# Patient Record
Sex: Male | Born: 2000 | Race: Black or African American | Hispanic: No | Marital: Single | Smoking: Current some day smoker
Health system: Southern US, Community
[De-identification: ages and names within clinical notes are randomized; demographics above are authoritative.]

## PROBLEM LIST (undated history)

## (undated) ENCOUNTER — Emergency Department (HOSPITAL_COMMUNITY): Admission: EM | Payer: Self-pay | Source: Home / Self Care

## (undated) DIAGNOSIS — J45909 Unspecified asthma, uncomplicated: Secondary | ICD-10-CM

## (undated) HISTORY — DX: Unspecified asthma, uncomplicated: J45.909

## (undated) HISTORY — PX: WISDOM TOOTH EXTRACTION: SHX21

---
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
## 2019-01-29 ENCOUNTER — Other Ambulatory Visit: Payer: Self-pay

## 2019-01-29 ENCOUNTER — Telehealth: Payer: Self-pay | Admitting: Pediatrics

## 2019-01-29 DIAGNOSIS — R112 Nausea with vomiting, unspecified: Secondary | ICD-10-CM

## 2019-01-29 NOTE — Telephone Encounter (Signed)
Sent rx to MD. Check pharmacy and it is correct.

## 2019-01-29 NOTE — Telephone Encounter (Signed)
Refill not appropriate

## 2019-01-29 NOTE — Telephone Encounter (Signed)
Error

## 2019-01-29 NOTE — Telephone Encounter (Signed)
Patient advised to contact their pharmacy to have electronic request sent over for all refills.     If request has been sent previously complete the following information:     Date request sent:advised to reach out to [place]    Name of Medication:[facility] [address]     Preferred Pharmacy:[facility]    Best contact Number: [phone]

## 2019-02-01 ENCOUNTER — Other Ambulatory Visit: Payer: Self-pay

## 2019-02-01 ENCOUNTER — Encounter: Payer: Self-pay | Admitting: Pediatrics

## 2019-02-01 ENCOUNTER — Ambulatory Visit (INDEPENDENT_AMBULATORY_CARE_PROVIDER_SITE_OTHER): Payer: Medicaid Other | Admitting: Pediatrics

## 2019-02-01 VITALS — HR 72 | Wt 173.1 lb

## 2019-02-01 DIAGNOSIS — B349 Viral infection, unspecified: Secondary | ICD-10-CM

## 2019-02-01 DIAGNOSIS — Z724 Inappropriate diet and eating habits: Secondary | ICD-10-CM

## 2019-02-01 DIAGNOSIS — J4521 Mild intermittent asthma with (acute) exacerbation: Secondary | ICD-10-CM | POA: Diagnosis not present

## 2019-02-01 DIAGNOSIS — K219 Gastro-esophageal reflux disease without esophagitis: Secondary | ICD-10-CM | POA: Diagnosis not present

## 2019-02-01 LAB — POC SOFIA SARS ANTIGEN FIA: SARS:: NEGATIVE

## 2019-02-01 MED ORDER — ALBUTEROL SULFATE HFA 108 (90 BASE) MCG/ACT IN AERS
4.0000 | INHALATION_SPRAY | Freq: Once | RESPIRATORY_TRACT | Status: AC
  Administered 2019-02-01: 17:00:00 4 via RESPIRATORY_TRACT

## 2019-02-01 MED ORDER — ALBUTEROL SULFATE HFA 108 (90 BASE) MCG/ACT IN AERS: INHALATION_SPRAY | RESPIRATORY_TRACT | 1 refills | Status: DC

## 2019-02-01 MED ORDER — MONTELUKAST SODIUM 10 MG PO TABS: 10.0000 mg | ORAL_TABLET | Freq: Every day | ORAL | 11 refills | Status: DC

## 2019-02-01 MED ORDER — LANSOPRAZOLE 30 MG PO CPDR: 30.0000 mg | DELAYED_RELEASE_CAPSULE | Freq: Two times a day (BID) | ORAL | 1 refills | Status: DC

## 2019-02-01 NOTE — Patient Instructions (Signed)
http://[email or web address].org/conditions-[email or web address]-[email or web address]">  Asthma, Adult  Asthma is a long-term (chronic) condition that causes recurrent episodes in which the airways become tight and narrow. The airways are the passages that lead from the nose and mouth down into the lungs. Asthma episodes, also called asthma attacks, can cause coughing, wheezing, shortness of breath, and chest pain. The airways can also fill with mucus. During an attack, it can be difficult to breathe. Asthma attacks can range from minor to life threatening. Asthma cannot be cured, but medicines and lifestyle changes can help control it and treat acute attacks. What are the causes? This condition is believed to be caused by inherited (genetic) and environmental factors, but its exact cause is not known. There are many things that can bring on an asthma attack or make asthma symptoms worse (triggers). Asthma triggers are different for each person. Common triggers include:  Mold.  Dust.  Cigarette smoke.  Cockroaches.  Things that can cause allergy symptoms (allergens), such as animal dander or pollen from trees or grass.  Air pollutants such as household cleaners, wood smoke, smog, or chemical odors.  Cold air, weather changes, and winds (which increase molds and pollen in the air).  Strong emotional expressions such as crying or laughing hard.  Stress.  Certain medicines (such as aspirin) or types of medicines (such as beta-blockers).  Sulfites in foods and drinks. Foods and drinks that may contain sulfites include dried fruit, potato chips, and sparkling grape juice.  Infections or inflammatory conditions such as the flu, a cold, or inflammation of the nasal membranes (rhinitis).  Gastroesophageal reflux disease (GERD).  Exercise or strenuous activity. What are the signs or symptoms? Symptoms of this condition may occur right after asthma is triggered or many hours later. Symptoms include:  Wheezing. This can  sound like whistling when you breathe.  Excessive nighttime or early morning coughing.  Frequent or severe coughing with a common cold.  Chest tightness.  Shortness of breath.  Tiredness (fatigue) with minimal activity. How is this diagnosed? This condition is diagnosed based on:  Your medical history.  A physical exam.  Tests, which may include: ? Lung function studies and pulmonary studies (spirometry). These tests can evaluate the flow of air in your lungs. ? Allergy tests. ? Imaging tests, such as X-rays. How is this treated? There is no cure for this condition, but treatment can help control your symptoms. Treatment for asthma usually involves:  Identifying and avoiding your asthma triggers.  Using medicines to control your symptoms. Generally, two types of medicines are used to treat asthma: ? Controller medicines. These help prevent asthma symptoms from occurring. They are usually taken every day. ? Fast-acting reliever or rescue medicines. These quickly relieve asthma symptoms by widening the narrow and tight airways. They are used as needed and provide short-term relief.  Using supplemental oxygen. This may be needed during a severe episode.  Using other medicines, such as: ? Allergy medicines, such as antihistamines, if your asthma attacks are triggered by allergens. ? Immune medicines (immunomodulators). These are medicines that help control the immune system.  Creating an asthma action plan. An asthma action plan is a written plan for managing and treating your asthma attacks. This plan includes: ? A list of your asthma triggers and how to avoid them. ? Information about when medicines should be taken and when their dosage should be changed. ? Instructions about using a device called a peak flow meter. A peak flow meter measures how well the lungs are working   and the severity of your asthma. It helps you monitor your condition. Follow these instructions at  home: Controlling your home environment Control your home environment in the following ways to help avoid triggers and prevent asthma attacks:  Change your heating and air conditioning filter regularly.  Limit your use of fireplaces and wood stoves.  Get rid of pests (such as roaches and mice) and their droppings.  Throw away plants if you see mold on them.  Clean floors and dust surfaces regularly. Use unscented cleaning products.  Try to have someone else vacuum for you regularly. Stay out of rooms while they are being vacuumed and for a short while afterward. If you vacuum, use a dust mask from a hardware store, a double-layered or microfilter vacuum cleaner bag, or a vacuum cleaner with a HEPA filter.  Replace carpet with wood, tile, or vinyl flooring. Carpet can trap dander and dust.  Use allergy-proof pillows, mattress covers, and box spring covers.  Keep your bedroom a trigger-free room.  Avoid pets and keep windows closed when allergens are in the air.  Wash beddings every week in hot water and dry them in a dryer.  Use blankets that are made of polyester or cotton.  Clean bathrooms and kitchens with bleach. If possible, have someone repaint the walls in these rooms with mold-resistant paint. Stay out of the rooms that are being cleaned and painted.  Wash your hands often with soap and water. If soap and water are not available, use hand sanitizer.  Do not allow anyone to smoke in your home. General instructions  Take over-the-counter and prescription medicines only as told by your health care provider. ? Speak with your health care provider if you have questions about how or when to take the medicines. ? Make note if you are requiring more frequent dosages.  Do not use any products that contain nicotine or tobacco, such as cigarettes and e-cigarettes. If you need help quitting, ask your health care provider. Also, avoid being exposed to secondhand smoke.  Use a peak  flow meter as told by your health care provider. Record and keep track of the readings.  Understand and use the asthma action plan to help minimize, or stop an asthma attack, without needing to seek medical care.  Make sure you stay up to date on your yearly vaccinations as told by your health care provider. This may include vaccines for the flu and pneumonia.  Avoid outdoor activities when allergen counts are high and when air quality is low.  Wear a ski mask that covers your nose and mouth during outdoor winter activities. Exercise indoors on cold days if you can.  Warm up before exercising, and take time for a cool-down period after exercise.  Keep all follow-up visits as told by your health care provider. This is important. Where to find more information  For information about asthma, turn to the Centers for Disease Control and Prevention at www.cdc.gov/asthma/faqs.htm  For air quality information, turn to AirNow at https://airnow.gov/ Contact a health care provider if:  You have wheezing, shortness of breath, or a cough even while you are taking medicine to prevent attacks.  The mucus you cough up (sputum) is thicker than usual.  Your sputum changes from clear or white to yellow, green, gray, or bloody.  Your medicines are causing side effects, such as a rash, itching, swelling, or trouble breathing.  You need to use a reliever medicine more than 2-3 times a week.  Your peak   flow reading is still at 50-79% of your personal best after following your action plan for 1 hour.  You have a fever. Get help right away if:  You are getting worse and do not respond to treatment during an asthma attack.  You are short of breath when at rest or when doing very little physical activity.  You have difficulty eating, drinking, or talking.  You have chest pain or tightness.  You develop a fast heartbeat or palpitations.  You have a bluish color to your lips or fingernails.  You  are light-headed or dizzy, or you faint.  Your peak flow reading is less than 50% of your personal best.  You feel too tired to breathe normally. Summary  Asthma is a long-term (chronic) condition that causes recurrent episodes in which the airways become tight and narrow. These episodes can cause coughing, wheezing, shortness of breath, and chest pain.  Asthma cannot be cured, but medicines and lifestyle changes can help control it and treat acute attacks.  Make sure you understand how to avoid triggers and how and when to use your medicines.  Asthma attacks can range from minor to life threatening. Get help right away if you have an asthma attack and do not respond to treatment with your usual rescue medicines. This information is not intended to replace advice given to you by your health care provider. Make sure you discuss any questions you have with your health care provider. Document Revised: 03/09/2018 Document Reviewed: 02/09/2016 Elsevier Patient Education  2020 [employer].    Food Choices for Gastroesophageal Reflux Disease, Adult When you have gastroesophageal reflux disease (GERD), the foods you eat and your eating habits are very important. Choosing the right foods can help ease the discomfort of GERD. Consider working with a diet and nutrition specialist (dietitian) to help you make healthy food choices. What general guidelines should I follow?  Eating plan  Choose healthy foods low in fat, such as fruits, vegetables, whole grains, low-fat dairy products, and lean meat, fish, and poultry.  Eat frequent, small meals instead of three large meals each day. Eat your meals slowly, in a relaxed setting. Avoid bending over or lying down until 2-3 hours after eating.  Limit high-fat foods such as fatty meats or fried foods.  Limit your intake of oils, butter, and shortening to less than 8 teaspoons each day.  Avoid the following: ? Foods that cause symptoms. These may be  different for different people. Keep a food diary to keep track of foods that cause symptoms. ? Alcohol. ? Drinking large amounts of liquid with meals. ? Eating meals during the 2-3 hours before bed.  Cook foods using methods other than frying. This may include baking, grilling, or broiling. Lifestyle  Maintain a healthy weight. Ask your health care provider what weight is healthy for you. If you need to lose weight, work with your health care provider to do so safely.  Exercise for at least 30 minutes on 5 or more days each week, or as told by your health care provider.  Avoid wearing clothes that fit tightly around your waist and chest.  Do not use any products that contain nicotine or tobacco, such as cigarettes and e-cigarettes. If you need help quitting, ask your health care provider.  Sleep with the head of your bed raised. Use a wedge under the mattress or blocks under the bed frame to raise the head of the bed. What foods are not recommended? The items listed  may not be a complete list. Talk with your dietitian about what dietary choices are best for you. Grains Pastries or quick breads with added fat. [place] toast. Vegetables Deep fried vegetables. [place] fries. Any vegetables prepared with added fat. Any vegetables that cause symptoms. For some people this may include tomatoes and tomato products, chili peppers, onions and garlic, and horseradish. Fruits Any fruits prepared with added fat. Any fruits that cause symptoms. For some people this may include citrus fruits, such as oranges, grapefruit, pineapple, and lemons. Meats and other protein foods High-fat meats, such as fatty beef or pork, hot dogs, ribs, ham, sausage, salami and bacon. Fried meat or protein, including fried fish and fried chicken. Nuts and nut butters. Dairy Whole milk and chocolate milk. Sour cream. Cream. Ice cream. Cream cheese. Milk shakes. Beverages Coffee and tea, with or without caffeine. Carbonated  beverages. Sodas. Energy drinks. Fruit juice made with acidic fruits (such as orange or grapefruit). Tomato juice. Alcoholic drinks. Fats and oils Butter. Margarine. Shortening. Ghee. Sweets and desserts Chocolate and cocoa. Donuts. Seasoning and other foods Pepper. Peppermint and spearmint. Any condiments, herbs, or seasonings that cause symptoms. For some people, this may include curry, hot sauce, or vinegar-based salad dressings. Summary  When you have gastroesophageal reflux disease (GERD), food and lifestyle choices are very important to help ease the discomfort of GERD.  Eat frequent, small meals instead of three large meals each day. Eat your meals slowly, in a relaxed setting. Avoid bending over or lying down until 2-3 hours after eating.  Limit high-fat foods such as fatty meat or fried foods. This information is not intended to replace advice given to you by your health care provider. Make sure you discuss any questions you have with your health care provider. Document Revised: 04/27/2018 Document Reviewed: 01/06/2016 Elsevier Patient Education  [place].

## 2019-02-01 NOTE — Progress Notes (Signed)
Subjective:   The patient is here today with his grandmother.     Chris Austin is a 19 y.o. male who presents for evaluation of a few concerns - chest tightness, reflux, and "eating disorder". Symptoms began 1 day ago for his chest tightness. He does have a history of asthma and his grandmother states that he has not used an albuterol inhaler since the chest tightness started.   Symptoms have been unchanged since that time. Past history is significant for asthma and allergies. In addition, he has been having nasal congestion and runny nose for the past few weeks. No fevers. No known exposures to COVID 19.   He also needs a refill of his reflux medication. He states that he had the wrong appt date and time for GI and needs a new referral. He states that he has had problems with reflux for years and it has been worsening over the past one year.   He states that he has an "eating disorder" as well and has had problems with his relationship with foods. He has not received any form of help or evaluation for this, despite it going on for years.   The following portions of the patient's history were reviewed and updated as appropriate: allergies, current medications, past family history, past medical history, past social history, past surgical history and problem list.  Review of Systems Constitutional: negative for fevers Eyes: negative for redness Ears, nose, mouth, throat, and face: negative except for nasal congestion Respiratory: negative except for asthma and chest tightness Gastrointestinal: negative except for reflux symptoms    Objective:    Pulse 72   Wt 173 lb 2 oz (78.5 kg)   SpO2 98%   BMI 22.23 kg/m  General appearance: alert and cooperative Head: Normocephalic, without obvious abnormality Eyes: negative findings: conjunctivae and sclerae normal Ears: normal TM's and external ear canals both ears Nose: clear discharge, moderate congestion Throat: lips, mucosa, and tongue  normal; teeth and gums normal Lungs: clear to auscultation bilaterally Heart: regular rate and rhythm, S1, S2 normal, no murmur, click, rub or gallop Abdomen: soft, non-tender; bowel sounds normal; no masses,  no organomegaly    Assessment:    Asthma exacerbation    Viral illness Problems with eating habits  Reflux    Plan:  .1. Mild intermittent asthma with exacerbation Pulse ox pre treatment - 98%  - POC SOFIA Antigen FIA negative  - albuterol (PROAIR HFA) 108 (90 Base) MCG/ACT inhaler; 2 puffs every 4 to 6 hours as needed for wheezing or coughing or shortness or breath  Dispense: 18 g; Refill: 1 - albuterol (VENTOLIN HFA) 108 (90 Base) MCG/ACT inhaler 4 puff - montelukast (SINGULAIR) 10 MG tablet; Take 1 tablet (10 mg total) by mouth at bedtime.  Dispense: 30 tablet; Refill: 11  2. Viral illness  Worsening signs and symptoms discussed. Rest, fluids, acetaminophen, and humidification. Follow up as needed for persistent, worsening cough, or appearance of new symptoms  3. Problems related to inappropriate diet and eating habits Referred to [facility] Specialist for initial evaluation   4. Gastroesophageal reflux disease, unspecified whether esophagitis present - Ambulatory referral to Gastroenterology - lansoprazole (PREVACID) 30 MG capsule; Take 1 capsule (30 mg total) by mouth 2 (two) times daily before a meal. Dispense generic for insurance.  Dispense: 60 capsule; Refill: 1     Appt scheduled to meet with out behavioral health specialist to discuss history of problems with eating .

## 2019-02-06 ENCOUNTER — Institutional Professional Consult (permissible substitution): Payer: Medicaid Other | Admitting: Licensed Clinical Social Worker

## 2019-02-06 ENCOUNTER — Encounter: Payer: Self-pay | Admitting: Gastroenterology

## 2019-02-26 ENCOUNTER — Encounter: Payer: Self-pay | Admitting: Gastroenterology

## 2019-02-26 ENCOUNTER — Telehealth: Payer: Self-pay | Admitting: Gastroenterology

## 2019-02-26 ENCOUNTER — Ambulatory Visit: Payer: Medicaid Other | Admitting: Gastroenterology

## 2019-02-26 NOTE — Telephone Encounter (Signed)
Patient was a no show 3rd time for new patient appointment

## 2019-02-26 NOTE — Telephone Encounter (Signed)
Please don't reschedule the patient and let the pcp know

## 2019-02-26 NOTE — Telephone Encounter (Signed)
Notified the pcp

## 2019-02-26 NOTE — Progress Notes (Deleted)
Primary Care Physician:  Richrd Sox, MD  Primary Gastroenterologist:    No chief complaint on file.   HPI:  Chris Austin is a 19 y.o. male here at the request of Dr. Dereck Leep for further evaluation of GERD.  He has had a couple of referrals over the past year, has not completed appointments.  Current Outpatient Medications  Medication Sig Dispense Refill  . albuterol (PROAIR HFA) 108 (90 Base) MCG/ACT inhaler 2 puffs every 4 to 6 hours as needed for wheezing or coughing or shortness or breath 18 g 1  . albuterol (PROVENTIL HFA;VENTOLIN HFA) 108 (90 Base) MCG/ACT inhaler Inhale 2 puffs into the lungs every 4 (four) hours as needed for wheezing (keep on at school). Take 4 puffs every 4 hours for the first 24 hours 2 Inhaler 1  . FLOVENT HFA 110 MCG/ACT inhaler One puff twice a day for asthma, brush teeth after using Flovent 1 Inhaler 5  . fluticasone (FLONASE) 50 MCG/ACT nasal spray PLACE 2 SPRAYS INTO BOTH NOSTRILS DAILY (Patient not taking: Reported on 03/30/2017) 16 g 2  . hydrocortisone 2.5 % cream APPLY TO ECZEMA TWICE DAILY FOR 1 WEEK AS NEEDED 120 g 0  . lansoprazole (PREVACID) 30 MG capsule Take 1 capsule (30 mg total) by mouth 2 (two) times daily before a meal. Dispense generic for insurance. 60 capsule 1  . loratadine (CLARITIN) 10 MG tablet Take 1 tablet (10 mg total) by mouth daily. 30 tablet 11  . montelukast (SINGULAIR) 10 MG tablet Take 1 tablet (10 mg total) by mouth at bedtime. 30 tablet 11  . SUMAtriptan (IMITREX) 25 MG tablet Take 1 tablet (25 mg total) by mouth 2 (two) times daily as needed for up to 9 days for migraine. May repeat in 2 hours if headache persists or recurs. 10 tablet 0   No current facility-administered medications for this visit.    Allergies as of 02/26/2019 - Review Complete 02/02/2019  Allergen Reaction Noted  . Amoxil [amoxicillin] Hives 10/05/2012    Past Medical History:  Diagnosis Date  . Unspecified asthma(493.90) 08/23/2012     No past surgical history on file.  Family History  Problem Relation Age of Onset  . Asthma Maternal Grandmother   . Healthy Mother   . Healthy Father     Social History   Socioeconomic History  . Marital status: Single    Spouse name: Not on file  . Number of children: Not on file  . Years of education: Not on file  . Highest education level: Not on file  Occupational History  . Not on file  Tobacco Use  . Smoking status: Passive Smoke Exposure - Never Smoker  . Smokeless tobacco: Never Used  . Tobacco comment: stepfather smokes outside  Substance and Sexual Activity  . Alcohol use: Not Currently  . Drug use: Never  . Sexual activity: Yes    Birth control/protection: Condom  Other Topics Concern  . Not on file  Social History Narrative   Lives with mother and stepfather      Social Determinants of Health   Financial Resource Strain: Medium Risk  . Difficulty of Paying Living Expenses: Somewhat hard  Food Insecurity: Food Insecurity Present  . Worried About Programme researcher, broadcasting/film/video in the Last Year: Never true  . Ran Out of Food in the Last Year: Sometimes true  Transportation Needs: No Transportation Needs  . Lack of Transportation (Medical): No  . Lack of Transportation (Non-Medical): No  Physical Activity: Insufficiently Active  . Days of Exercise per Week: 2 days  . Minutes of Exercise per Session: 30 min  Stress: Stress Concern Present  . Feeling of Stress : Very much  Social Connections: Somewhat Isolated  . Frequency of Communication with Friends and Family: More than three times a week  . Frequency of Social Gatherings with Friends and Family: More than three times a week  . Attends Religious Services: Never  . Active Member of Clubs or Organizations: No  . Attends Archivist Meetings: Never  . Marital Status: Living with partner  Intimate Partner Violence: Not At Risk  . Fear of Current or Ex-Partner: No  . Emotionally Abused: No  .  Physically Abused: No  . Sexually Abused: No      ROS:  General: Negative for anorexia, weight loss, fever, chills, fatigue, weakness. Eyes: Negative for vision changes.  ENT: Negative for hoarseness, difficulty swallowing , nasal congestion. CV: Negative for chest pain, angina, palpitations, dyspnea on exertion, peripheral edema.  Respiratory: Negative for dyspnea at rest, dyspnea on exertion, cough, sputum, wheezing.  GI: See history of present illness. GU:  Negative for dysuria, hematuria, urinary incontinence, urinary frequency, nocturnal urination.  MS: Negative for joint pain, low back pain.  Derm: Negative for rash or itching.  Neuro: Negative for weakness, abnormal sensation, seizure, frequent headaches, memory loss, confusion.  Psych: Negative for anxiety, depression, suicidal ideation, hallucinations.  Endo: Negative for unusual weight change.  Heme: Negative for bruising or bleeding. Allergy: Negative for rash or hives.    Physical Examination:  There were no vitals taken for this visit.   General: Well-nourished, well-developed in no acute distress.  Head: Normocephalic, atraumatic.   Eyes: Conjunctiva pink, no icterus. Mouth: Oropharyngeal mucosa moist and pink , no lesions erythema or exudate. Neck: Supple without thyromegaly, masses, or lymphadenopathy.  Lungs: Clear to auscultation bilaterally.  Heart: Regular rate and rhythm, no murmurs rubs or gallops.  Abdomen: Bowel sounds are normal, nontender, nondistended, no hepatosplenomegaly or masses, no abdominal bruits or    hernia , no rebound or guarding.   Rectal: *** Extremities: No lower extremity edema. No clubbing or deformities.  Neuro: Alert and oriented x 4 , grossly normal neurologically.  Skin: Warm and dry, no rash or jaundice.   Psych: Alert and cooperative, normal mood and affect.  Labs: Lab Results  Component Value Date   CREATININE 0.99 02/06/2018   BUN 13 02/06/2018   NA 142 02/06/2018   K  4.4 02/06/2018   CL 103 02/06/2018   CO2 24 02/06/2018   Lab Results  Component Value Date   ALT 8 02/06/2018   AST 14 02/06/2018   ALKPHOS 68 02/06/2018   BILITOT 0.8 02/06/2018   Lab Results  Component Value Date   WBC 6.3 01/17/2018   HGB 14.8 01/17/2018   HCT 41.9 01/17/2018   MCV 87 01/17/2018   PLT 246 01/17/2018     Imaging Studies: No results found.

## 2019-04-26 ENCOUNTER — Ambulatory Visit
Admission: EM | Admit: 2019-04-26 | Discharge: 2019-04-26 | Disposition: A | Discharge status: Home / Self Care | Payer: Medicaid Other | Attending: Emergency Medicine | Admitting: Emergency Medicine

## 2019-04-26 ENCOUNTER — Encounter: Payer: Self-pay | Admitting: Emergency Medicine

## 2019-04-26 ENCOUNTER — Other Ambulatory Visit: Payer: Self-pay

## 2019-04-26 DIAGNOSIS — K0889 Other specified disorders of teeth and supporting structures: Secondary | ICD-10-CM | POA: Diagnosis not present

## 2019-04-26 DIAGNOSIS — K029 Dental caries, unspecified: Secondary | ICD-10-CM

## 2019-04-26 MED ORDER — CHLORHEXIDINE GLUCONATE 0.12 % MT SOLN: 15.0000 mL | Freq: Two times a day (BID) | OROMUCOSAL | 0 refills | Status: DC

## 2019-04-26 MED ORDER — MELOXICAM 7.5 MG PO TABS: 7.5000 mg | ORAL_TABLET | Freq: Every day | ORAL | 0 refills | Status: DC

## 2019-04-26 MED ORDER — CLINDAMYCIN HCL 300 MG PO CAPS: 300.0000 mg | ORAL_CAPSULE | Freq: Three times a day (TID) | ORAL | 0 refills | Status: AC

## 2019-04-26 NOTE — ED Provider Notes (Signed)
[facility]   [id number] 04/26/19 Arrival Time: 1106  CC: DENTAL PAIN  SUBJECTIVE:  Chris Austin is a 19 y.o. male who presents with left lower dental pain x 2 weeks.  Denies a precipitating event or trauma.  Has tried OTC analgesics without relief.  Worse with chewing.  Does not see a dentist regularly.  Reports similar symptoms in the past.  Denies fever, chills, dysphagia, odynophagia, oral or neck swelling, nausea, vomiting, chest pain, SOB.    Tried to get in to see dentist but was rescheduled due to COVID  ROS: As per HPI.  All other pertinent ROS negative.     Past Medical History:  Diagnosis Date  . Unspecified asthma(493.90) 08/23/2012   Past Surgical History:  Procedure Laterality Date  . WISDOM TOOTH EXTRACTION     Allergies  Allergen Reactions  . Amoxil [Amoxicillin] Hives   No current facility-administered medications on file prior to encounter.   Current Outpatient Medications on File Prior to Encounter  Medication Sig Dispense Refill  . albuterol (PROAIR HFA) 108 (90 Base) MCG/ACT inhaler 2 puffs every 4 to 6 hours as needed for wheezing or coughing or shortness or breath 18 g 1  . FLOVENT HFA 110 MCG/ACT inhaler One puff twice a day for asthma, brush teeth after using Flovent 1 Inhaler 5  . fluticasone (FLONASE) 50 MCG/ACT nasal spray PLACE 2 SPRAYS INTO BOTH NOSTRILS DAILY 16 g 2  . hydrocortisone 2.5 % cream APPLY TO ECZEMA TWICE DAILY FOR 1 WEEK AS NEEDED 120 g 0  . lansoprazole (PREVACID) 30 MG capsule Take 1 capsule (30 mg total) by mouth 2 (two) times daily before a meal. Dispense generic for insurance. 60 capsule 1  . loratadine (CLARITIN) 10 MG tablet Take 1 tablet (10 mg total) by mouth daily. 30 tablet 11  . montelukast (SINGULAIR) 10 MG tablet Take 1 tablet (10 mg total) by mouth at bedtime. 30 tablet 11  . SUMAtriptan (IMITREX) 25 MG tablet Take 1 tablet (25 mg total) by mouth 2 (two) times daily as needed for up to 9 days for migraine.  May repeat in 2 hours if headache persists or recurs. 10 tablet 0  . albuterol (PROVENTIL HFA;VENTOLIN HFA) 108 (90 Base) MCG/ACT inhaler Inhale 2 puffs into the lungs every 4 (four) hours as needed for wheezing (keep on at school). Take 4 puffs every 4 hours for the first 24 hours 2 Inhaler 1   Social History   Socioeconomic History  . Marital status: Single    Spouse name: Not on file  . Number of children: Not on file  . Years of education: Not on file  . Highest education level: Not on file  Occupational History  . Not on file  Tobacco Use  . Smoking status: Passive Smoke Exposure - Never Smoker  . Smokeless tobacco: Never Used  . Tobacco comment: stepfather smokes outside  Substance and Sexual Activity  . Alcohol use: Not Currently  . Drug use: Never  . Sexual activity: Yes    Birth control/protection: Condom  Other Topics Concern  . Not on file  Social History Narrative   Lives with mother and stepfather      Social Determinants of Health   Financial Resource Strain: Medium Risk  . Difficulty of Paying Living Expenses: Somewhat hard  Food Insecurity: Food Insecurity Present  . Worried About Programme researcher, broadcasting/film/video in the Last Year: Never true  . Ran Out of Food in the Last Year:  Sometimes true  Transportation Needs: No Transportation Needs  . Lack of Transportation (Medical): No  . Lack of Transportation (Non-Medical): No  Physical Activity: Insufficiently Active  . Days of Exercise per Week: 2 days  . Minutes of Exercise per Session: 30 min  Stress: Stress Concern Present  . Feeling of Stress : Very much  Social Connections: Somewhat Isolated  . Frequency of Communication with Friends and Family: More than three times a week  . Frequency of Social Gatherings with Friends and Family: More than three times a week  . Attends Religious Services: Never  . Active Member of Clubs or Organizations: No  . Attends Archivist Meetings: Never  . Marital Status:  Living with partner  Intimate Partner Violence: Not At Risk  . Fear of Current or Ex-Partner: No  . Emotionally Abused: No  . Physically Abused: No  . Sexually Abused: No   Family History  Problem Relation Age of Onset  . Asthma Maternal Grandmother   . Healthy Mother   . Healthy Father     OBJECTIVE:  Vitals:   04/26/19 1112  BP: 131/79  Pulse: 84  Resp: 17  Temp: 98.4 F (36.9 C)  TempSrc: Oral  SpO2: 98%    General appearance: alert; no distress HENT: normocephalic; atraumatic; dentition: fair; dental caries over left lower gums without areas of fluctuance; oropharynx clear, tolerating own secretions Neck: supple without LAD Lungs: normal respirations Skin: warm and dry Psychological: alert and cooperative; normal mood and affect  ASSESSMENT & PLAN:  1. Pain, dental   2. Dental caries     Meds ordered this encounter  Medications  . clindamycin (CLEOCIN) 300 MG capsule    Sig: Take 1 capsule (300 mg total) by mouth 3 (three) times daily for 10 days.    Dispense:  30 capsule    Refill:  0    Order Specific Question:   Supervising Provider    Answer:   Raylene Everts [[id number]]  . meloxicam (MOBIC) 7.5 MG tablet    Sig: Take 1 tablet (7.5 mg total) by mouth daily.    Dispense:  30 tablet    Refill:  0    Order Specific Question:   Supervising Provider    Answer:   Raylene Everts [[id number]]  . chlorhexidine (PERIDEX) 0.12 % solution    Sig: Use as directed 15 mLs in the mouth or throat 2 (two) times daily.    Dispense:  473 mL    Refill:  0    Order Specific Question:   Supervising Provider    Answer:   Raylene Everts [[id number]]   Clindamycin prescribed.  Take as directed and to completion Mobic and peridex prescribed.  Use as directed for pain Rest and push fluids maintain oral hygiene Avoid chewing on painful side of mouth Follow up with dentist as soon as possible for further evaluation and treatment  Return or go to the ED if you have any  new or worsening symptoms such as fever, chills, difficulty swallowing, painful swallowing, oral or neck swelling, nausea, vomiting, chest pain, SOB, etc...  Reviewed expectations re: course of current medical issues. Questions answered. Outlined signs and symptoms indicating need for more acute intervention. Patient verbalized understanding. After Visit Summary given.   Lestine Box, PA-C 04/26/19 1123

## 2019-04-26 NOTE — ED Triage Notes (Signed)
Pt presents with left lower dental pain that started 2 weeks ago. Denies fevers at home. Denies injury.

## 2019-04-26 NOTE — Discharge Instructions (Addendum)
Clindamycin prescribed.  Take as directed and to completion Mobic and peridex prescribed.  Use as directed for pain Rest and push fluids maintain oral hygiene Avoid chewing on painful side of mouth Follow up with dentist as soon as possible for further evaluation and treatment  Return or go to the ED if you have any new or worsening symptoms such as fever, chills, difficulty swallowing, painful swallowing, oral or neck swelling, nausea, vomiting, chest pain, SOB, etc..Marland Kitchen

## 2019-09-06 ENCOUNTER — Ambulatory Visit (INDEPENDENT_AMBULATORY_CARE_PROVIDER_SITE_OTHER): Payer: Medicaid Other | Admitting: Pediatrics

## 2019-09-06 ENCOUNTER — Other Ambulatory Visit: Payer: Self-pay

## 2019-09-06 DIAGNOSIS — Z711 Person with feared health complaint in whom no diagnosis is made: Secondary | ICD-10-CM | POA: Diagnosis not present

## 2019-09-13 ENCOUNTER — Encounter: Payer: Self-pay | Admitting: Pediatrics

## 2019-09-13 NOTE — Progress Notes (Signed)
Chelsea is here because his mom called and made the appointment with a complaint of a knot in the back of his head that she noticed while he was getting his hair braided. He does not know how long it's been present. She also mentioned a knot in his private area which he states has been gone for a week. There has been no fever, no tenderness in either area, no drainage from the lesions in either area. No warmth.    No distress White sclera, no conjunctival injection No cervical lymphadenopathy  Bony 3 cm in mid occipital region. Non tender, non drainage, no alopecia  Normal circumcised penis, no lesions, no swelling, and no tenderness of scrotum.  No focal deficits   19 yo male with bony skull lesion benign and resolved scrotal lesion  Reassurance  Questions and concerns were addressed  Follow up

## 2019-10-03 DIAGNOSIS — F3112 Bipolar disorder, current episode manic without psychotic features, moderate: Secondary | ICD-10-CM | POA: Diagnosis not present

## 2019-10-23 ENCOUNTER — Encounter: Payer: Self-pay | Admitting: Emergency Medicine

## 2019-10-23 ENCOUNTER — Ambulatory Visit
Admission: EM | Admit: 2019-10-23 | Discharge: 2019-10-23 | Disposition: A | Discharge status: Home / Self Care | Payer: Medicaid Other | Attending: Emergency Medicine | Admitting: Emergency Medicine

## 2019-10-23 ENCOUNTER — Other Ambulatory Visit: Payer: Self-pay

## 2019-10-23 DIAGNOSIS — K0889 Other specified disorders of teeth and supporting structures: Secondary | ICD-10-CM

## 2019-10-23 DIAGNOSIS — Z1152 Encounter for screening for COVID-19: Secondary | ICD-10-CM

## 2019-10-23 DIAGNOSIS — K029 Dental caries, unspecified: Secondary | ICD-10-CM | POA: Diagnosis not present

## 2019-10-23 MED ORDER — CLINDAMYCIN HCL 300 MG PO CAPS: 300.0000 mg | ORAL_CAPSULE | Freq: Three times a day (TID) | ORAL | 0 refills | Status: AC

## 2019-10-23 MED ORDER — CHLORHEXIDINE GLUCONATE 0.12 % MT SOLN: 15.0000 mL | Freq: Three times a day (TID) | OROMUCOSAL | 0 refills | Status: DC

## 2019-10-23 NOTE — ED Provider Notes (Signed)
[facility]   [id number] 10/23/19 Arrival Time: 1707  CC: DENTAL PAIN  SUBJECTIVE:  Chris Austin is a 19 y.o. male who presents with dental pain for the past 1 month.  Denies a precipitating event or trauma.  Localizes pain to left lower gum.  Has tried OTC analgesics without relief.  Worse with chewing.  Does not see a dentist regularly.  Denies similar symptoms in the past.  Denies fever, chills, dysphagia, odynophagia, oral or neck swelling, nausea, vomiting, chest pain, SOB.    He would like to have COVID-19 test completed.  ROS: As per HPI.  All other pertinent ROS negative.     Past Medical History:  Diagnosis Date  . Unspecified asthma(493.90) 08/23/2012   Past Surgical History:  Procedure Laterality Date  . WISDOM TOOTH EXTRACTION     Allergies  Allergen Reactions  . Amoxil [Amoxicillin] Hives   No current facility-administered medications on file prior to encounter.   Current Outpatient Medications on File Prior to Encounter  Medication Sig Dispense Refill  . albuterol (PROAIR HFA) 108 (90 Base) MCG/ACT inhaler 2 puffs every 4 to 6 hours as needed for wheezing or coughing or shortness or breath 18 g 1  . albuterol (PROVENTIL HFA;VENTOLIN HFA) 108 (90 Base) MCG/ACT inhaler Inhale 2 puffs into the lungs every 4 (four) hours as needed for wheezing (keep on at school). Take 4 puffs every 4 hours for the first 24 hours 2 Inhaler 1  . FLOVENT HFA 110 MCG/ACT inhaler One puff twice a day for asthma, brush teeth after using Flovent 1 Inhaler 5  . fluticasone (FLONASE) 50 MCG/ACT nasal spray PLACE 2 SPRAYS INTO BOTH NOSTRILS DAILY 16 g 2  . hydrocortisone 2.5 % cream APPLY TO ECZEMA TWICE DAILY FOR 1 WEEK AS NEEDED 120 g 0  . lansoprazole (PREVACID) 30 MG capsule Take 1 capsule (30 mg total) by mouth 2 (two) times daily before a meal. Dispense generic for insurance. 60 capsule 1  . loratadine (CLARITIN) 10 MG tablet Take 1 tablet (10 mg total) by mouth daily. 30  tablet 11  . meloxicam (MOBIC) 7.5 MG tablet Take 1 tablet (7.5 mg total) by mouth daily. 30 tablet 0  . montelukast (SINGULAIR) 10 MG tablet Take 1 tablet (10 mg total) by mouth at bedtime. 30 tablet 11  . SUMAtriptan (IMITREX) 25 MG tablet Take 1 tablet (25 mg total) by mouth 2 (two) times daily as needed for up to 9 days for migraine. May repeat in 2 hours if headache persists or recurs. 10 tablet 0   Social History   Socioeconomic History  . Marital status: Single    Spouse name: Not on file  . Number of children: Not on file  . Years of education: Not on file  . Highest education level: Not on file  Occupational History  . Not on file  Tobacco Use  . Smoking status: Passive Smoke Exposure - Never Smoker  . Smokeless tobacco: Never Used  . Tobacco comment: stepfather smokes outside  Vaping Use  . Vaping Use: Never used  Substance and Sexual Activity  . Alcohol use: Not Currently  . Drug use: Never  . Sexual activity: Yes    Birth control/protection: Condom  Other Topics Concern  . Not on file  Social History Narrative   Lives with mother and stepfather      Social Determinants of Health   Financial Resource Strain:   . Difficulty of Paying Living Expenses: Not on file  Food Insecurity:   . Worried About Programme researcher, broadcasting/film/video in the Last Year: Not on file  . Ran Out of Food in the Last Year: Not on file  Transportation Needs:   . Lack of Transportation (Medical): Not on file  . Lack of Transportation (Non-Medical): Not on file  Physical Activity:   . Days of Exercise per Week: Not on file  . Minutes of Exercise per Session: Not on file  Stress:   . Feeling of Stress : Not on file  Social Connections:   . Frequency of Communication with Friends and Family: Not on file  . Frequency of Social Gatherings with Friends and Family: Not on file  . Attends Religious Services: Not on file  . Active Member of Clubs or Organizations: Not on file  . Attends Tax inspector Meetings: Not on file  . Marital Status: Not on file  Intimate Partner Violence:   . Fear of Current or Ex-Partner: Not on file  . Emotionally Abused: Not on file  . Physically Abused: Not on file  . Sexually Abused: Not on file   Family History  Problem Relation Age of Onset  . Asthma Maternal Grandmother   . Healthy Mother   . Healthy Father     OBJECTIVE:  Vitals:   10/23/19 1742 10/23/19 1743  BP: 124/70   Pulse: 83   Resp: 17   Temp: 97.9 F (36.6 C)   TempSrc: Oral   SpO2: 99%   Weight:  160 lb (72.6 kg)  Height:  6\' 4"  (1.93 m)    Physical Exam Vitals and nursing note reviewed.  Constitutional:      General: He is not in acute distress.    Appearance: Normal appearance. He is normal weight. He is not ill-appearing, toxic-appearing or diaphoretic.  HENT:     Right Ear: Tympanic membrane, ear canal and external ear normal. There is no impacted cerumen.     Left Ear: Tympanic membrane, ear canal and external ear normal. There is no impacted cerumen.     Mouth/Throat:     Lips: Pink.     Mouth: Mucous membranes are moist.     Dentition: Abnormal dentition. Dental caries present. No dental abscesses.     Tonsils: No tonsillar exudate or tonsillar abscesses.      Comments: Dental caries present Cardiovascular:     Rate and Rhythm: Normal rate and regular rhythm.     Pulses: Normal pulses.     Heart sounds: Normal heart sounds. No murmur heard.  No friction rub. No gallop.   Pulmonary:     Effort: Pulmonary effort is normal. No respiratory distress.     Breath sounds: Normal breath sounds. No stridor. No wheezing, rhonchi or rales.  Chest:     Chest wall: No tenderness.  Neurological:     Mental Status: He is alert and oriented to person, place, and time.      ASSESSMENT & PLAN:  1. Encounter for screening for COVID-19   2. Pain, dental   3. Dental caries     Meds ordered this encounter  Medications  . clindamycin (CLEOCIN) 300 MG  capsule    Sig: Take 1 capsule (300 mg total) by mouth 3 (three) times daily for 7 days.    Dispense:  21 capsule    Refill:  0  . chlorhexidine (PERIDEX) 0.12 % solution    Sig: Use as directed 15 mLs in the mouth or throat 3 (three) times  daily after meals.    Dispense:  120 mL    Refill:  0    Discharge instructions  Chlorhexidine mouthwash was prescribed/take as directed Clindamycin was prescribed Continue to take OTC Tylenol/ibuprofen as needed for pain Recommend soft diet until evaluated by dentist Maintain oral hygiene care Follow up with dentist as soon as possible for further evaluation and treatment  Return or go to the ED if you have any new or worsening symptoms such as fever, chills, difficulty swallowing, painful swallowing, oral or neck swelling, nausea, vomiting, chest pain, SOB, etc...  Reviewed expectations re: course of current medical issues. Questions answered. Outlined signs and symptoms indicating need for more acute intervention. Patient verbalized understanding. After Visit Summary given.   Durward Parcel, FNP 10/23/19 1757

## 2019-10-23 NOTE — Discharge Instructions (Signed)
Chlorhexidine mouthwash was prescribed/take as directed Clindamycin was prescribed Continue to take OTC Tylenol/ibuprofen as needed for pain Recommend soft diet until evaluated by dentist Maintain oral hygiene care Follow up with dentist as soon as possible for further evaluation and treatment  Return or go to the ED if you have any new or worsening symptoms such as fever, chills, difficulty swallowing, painful swallowing, oral or neck swelling, nausea, vomiting, chest pain, SOB, etc..Marland Kitchen

## 2019-10-23 NOTE — ED Triage Notes (Signed)
Dental pain to lower LT side x 1 month. Wants covid test, no s/s

## 2019-10-24 DIAGNOSIS — K047 Periapical abscess without sinus: Secondary | ICD-10-CM | POA: Diagnosis not present

## 2019-10-24 LAB — SARS-COV-2, NAA 2 DAY TAT

## 2019-10-24 LAB — NOVEL CORONAVIRUS, NAA: SARS-CoV-2, NAA: NOT DETECTED

## 2019-12-11 ENCOUNTER — Ambulatory Visit: Payer: Medicaid Other

## 2019-12-30 ENCOUNTER — Emergency Department (HOSPITAL_COMMUNITY): Payer: Medicaid Other

## 2019-12-30 ENCOUNTER — Other Ambulatory Visit: Payer: Self-pay

## 2019-12-30 ENCOUNTER — Emergency Department (HOSPITAL_COMMUNITY)
Admission: EM | Admit: 2019-12-30 | Discharge: 2019-12-30 | Disposition: A | Discharge status: Home / Self Care | Payer: Medicaid Other | Attending: Emergency Medicine | Admitting: Emergency Medicine

## 2019-12-30 DIAGNOSIS — Y92410 Unspecified street and highway as the place of occurrence of the external cause: Secondary | ICD-10-CM | POA: Diagnosis not present

## 2019-12-30 DIAGNOSIS — T1490XA Injury, unspecified, initial encounter: Secondary | ICD-10-CM

## 2019-12-30 DIAGNOSIS — S0181XA Laceration without foreign body of other part of head, initial encounter: Secondary | ICD-10-CM | POA: Insufficient documentation

## 2019-12-30 DIAGNOSIS — S62171A Displaced fracture of trapezium [larger multangular], right wrist, initial encounter for closed fracture: Secondary | ICD-10-CM | POA: Diagnosis not present

## 2019-12-30 DIAGNOSIS — S0990XA Unspecified injury of head, initial encounter: Secondary | ICD-10-CM | POA: Diagnosis not present

## 2019-12-30 DIAGNOSIS — S0003XA Contusion of scalp, initial encounter: Secondary | ICD-10-CM | POA: Diagnosis not present

## 2019-12-30 DIAGNOSIS — M25531 Pain in right wrist: Secondary | ICD-10-CM | POA: Diagnosis not present

## 2019-12-30 DIAGNOSIS — R9431 Abnormal electrocardiogram [ECG] [EKG]: Secondary | ICD-10-CM | POA: Diagnosis not present

## 2019-12-30 DIAGNOSIS — Z23 Encounter for immunization: Secondary | ICD-10-CM | POA: Insufficient documentation

## 2019-12-30 DIAGNOSIS — R0789 Other chest pain: Secondary | ICD-10-CM | POA: Insufficient documentation

## 2019-12-30 DIAGNOSIS — S3993XA Unspecified injury of pelvis, initial encounter: Secondary | ICD-10-CM | POA: Diagnosis not present

## 2019-12-30 DIAGNOSIS — Z041 Encounter for examination and observation following transport accident: Secondary | ICD-10-CM | POA: Diagnosis not present

## 2019-12-30 DIAGNOSIS — S6991XA Unspecified injury of right wrist, hand and finger(s), initial encounter: Secondary | ICD-10-CM | POA: Diagnosis present

## 2019-12-30 DIAGNOSIS — S199XXA Unspecified injury of neck, initial encounter: Secondary | ICD-10-CM | POA: Diagnosis not present

## 2019-12-30 LAB — SAMPLE TO BLOOD BANK

## 2019-12-30 LAB — COMPREHENSIVE METABOLIC PANEL
ALT: 18 U/L (ref 0–44)
AST: 22 U/L (ref 15–41)
Albumin: 4.3 g/dL (ref 3.5–5.0)
Alkaline Phosphatase: 59 U/L (ref 38–126)
Anion gap: 11 (ref 5–15)
BUN: 11 mg/dL (ref 6–20)
CO2: 22 mmol/L (ref 22–32)
Calcium: 9 mg/dL (ref 8.9–10.3)
Chloride: 106 mmol/L (ref 98–111)
Creatinine, Ser: 0.85 mg/dL (ref 0.61–1.24)
GFR, Estimated: >60 mL/min (ref >60)
Glucose, Bld: 114 mg/dL — ABNORMAL HIGH (ref 70–99)
Potassium: 3.7 mmol/L (ref 3.5–5.1)
Sodium: 139 mmol/L (ref 135–145)
Total Bilirubin: 0.9 mg/dL (ref 0.3–1.2)
Total Protein: 7 g/dL (ref 6.5–8.1)

## 2019-12-30 LAB — I-STAT CHEM 8, ED
BUN: 14 mg/dL (ref 6–20)
Calcium, Ion: 1.15 mmol/L (ref 1.15–1.40)
Chloride: 105 mmol/L (ref 98–111)
Creatinine, Ser: 0.8 mg/dL (ref 0.61–1.24)
Glucose, Bld: 111 mg/dL — ABNORMAL HIGH (ref 70–99)
HCT: 41 % (ref 39.0–52.0)
Hemoglobin: 13.9 g/dL (ref 13.0–17.0)
Potassium: 3.9 mmol/L (ref 3.5–5.1)
Sodium: 142 mmol/L (ref 135–145)
TCO2: 27 mmol/L (ref 22–32)

## 2019-12-30 LAB — ETHANOL: Alcohol, Ethyl (B): <10 mg/dL (ref <10)

## 2019-12-30 LAB — CBC
HCT: 39.8 % (ref 39.0–52.0)
Hemoglobin: 13.1 g/dL (ref 13.0–17.0)
MCH: 29.6 pg (ref 26.0–34.0)
MCHC: 32.9 g/dL (ref 30.0–36.0)
MCV: 90 fL (ref 80.0–100.0)
Platelets: 235 10*3/uL (ref 150–400)
RBC: 4.42 MIL/uL (ref 4.22–5.81)
RDW: 13 % (ref 11.5–15.5)
WBC: 7.4 10*3/uL (ref 4.0–10.5)
nRBC: 0 % (ref 0.0–0.2)

## 2019-12-30 LAB — LACTIC ACID, PLASMA: Lactic Acid, Venous: 1.9 mmol/L (ref 0.5–1.9)

## 2019-12-30 MED ORDER — IOHEXOL 300 MG/ML  SOLN
75.0000 mL | Freq: Once | INTRAMUSCULAR | Status: AC | PRN
  Administered 2019-12-30: 75 mL via INTRAVENOUS

## 2019-12-30 MED ORDER — METOCLOPRAMIDE HCL 5 MG/ML IJ SOLN
10.0000 mg | INTRAMUSCULAR | Status: AC
  Administered 2019-12-30: 06:00:00 10 mg via INTRAVENOUS
  Filled 2019-12-30: qty 2

## 2019-12-30 MED ORDER — LIDOCAINE-EPINEPHRINE 1 %-1:100000 IJ SOLN
10.0000 mL | Freq: Once | INTRAMUSCULAR | Status: AC
  Administered 2019-12-30: 06:00:00 10 mL
  Filled 2019-12-30: qty 1

## 2019-12-30 MED ORDER — SODIUM CHLORIDE 0.9 % IV BOLUS
500.0000 mL | Freq: Once | INTRAVENOUS | Status: AC
Start: 2019-12-30 — End: 2019-12-30
  Administered 2019-12-30: 04:00:00 500 mL via INTRAVENOUS

## 2019-12-30 MED ORDER — KETOROLAC TROMETHAMINE 15 MG/ML IJ SOLN
15.0000 mg | Freq: Once | INTRAMUSCULAR | Status: AC
  Administered 2019-12-30: 07:00:00 15 mg via INTRAVENOUS
  Filled 2019-12-30: qty 1

## 2019-12-30 MED ORDER — TETANUS-DIPHTH-ACELL PERTUSSIS 5-2.5-18.5 LF-MCG/0.5 IM SUSY
0.5000 mL | PREFILLED_SYRINGE | Freq: Once | INTRAMUSCULAR | Status: AC
  Administered 2019-12-30: 04:00:00 0.5 mL via INTRAMUSCULAR
  Filled 2019-12-30: qty 0.5

## 2019-12-30 MED ORDER — FENTANYL CITRATE (PF) 100 MCG/2ML IJ SOLN
50.0000 ug | Freq: Once | INTRAMUSCULAR | Status: AC
Start: 2019-12-30 — End: 2019-12-30
  Administered 2019-12-30: 06:00:00 50 ug via INTRAVENOUS
  Filled 2019-12-30: qty 2

## 2019-12-30 MED ORDER — NAPROXEN 500 MG PO TABS: 500.0000 mg | ORAL_TABLET | Freq: Two times a day (BID) | ORAL | 0 refills | Status: DC

## 2019-12-30 MED ORDER — METHOCARBAMOL 500 MG PO TABS: 500.0000 mg | ORAL_TABLET | Freq: Two times a day (BID) | ORAL | 0 refills | Status: DC | PRN
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
## 2019-12-30 NOTE — Discharge Instructions (Addendum)
Your found to have a fracture of your trapezium which is a bone in your wrist.  Wear the splint at all times and do not remove it.  Call to schedule a follow-up with hand specialist, Dr. Eulah Pont, to ensure proper healing.  You have been prescribed naproxen and Robaxin for management of your pain and muscle spasms.  Alternate ice and heat to areas of injury to help improve pain, as well.  With regard to your laceration, avoid soaking your wound in stagnant or dirty water such as while taking a bath. You can shower normally. Keep the area clean with mild soap and warm water. Do not apply peroxide or alcohol to your wound as this can break down newly forming skin and prolong wound healing. If you keep the area bandaged, change the dressing/bandage at least once per day. Have your sutures removed in 7-10 days.

## 2019-12-30 NOTE — Progress Notes (Signed)
Orthopedic Tech Progress Note Patient Details:  Chris Austin Jan 31, 2000 [id number]  Ortho Devices Type of Ortho Device: Arm sling,Thumb spica splint Splint Material: Fiberglass Ortho Device/Splint Location: rue Ortho Device/Splint Interventions: Ordered,Application,Adjustment   Post Interventions Patient Tolerated: Well Instructions Provided: Care of device,Adjustment of device   Trinna Post 12/30/2019, 6:52 AM

## 2019-12-30 NOTE — ED Notes (Signed)
Pt ambulatory to the restroom.  
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
## 2019-12-30 NOTE — ED Notes (Signed)
6 stiches placed on head lac.

## 2019-12-31 ENCOUNTER — Telehealth: Payer: Self-pay | Admitting: Licensed Clinical Social Worker

## 2019-12-31 ENCOUNTER — Telehealth: Payer: Self-pay

## 2019-12-31 ENCOUNTER — Encounter: Payer: Self-pay | Admitting: Emergency Medicine

## 2019-12-31 NOTE — Telephone Encounter (Signed)
Pediatric Transition Care Management Follow-up Telephone Call  Medicaid Managed Care Transition Call Status:  MM TOC Call Made  Symptoms: Has Coley A Valera developed any new symptoms since being discharged from the hospital? no  Diet/Feeding: Was your child's diet modified? no  If no- Is Jahmari A Lemler eating their normal diet?  (over 1 year) pt was supposed to follow up with stomach specialist per Mom but was not able to.  Mom would like to follow up with Dr. Laural Benes on this as well.   Home Care and Equipment/Supplies: Were home health services ordered? no Were any new equipment or medical supplies ordered?  no   Follow Up: Was there a hospital follow up appointment recommended for your child with their PCP? yes [facility]  Date/Time 01/07/20 @ 1:30pm (not all patients peds need a PCP follow up/depends on the diagnosis)   Do you have the contact number to reach the patient's PCP? yes  Was the patient referred to a specialist? no  Are transportation arrangements needed? no  If you notice any changes in Rimas A Mullane condition, call their primary care doctor or go to the Emergency Dept.  Do you have any other questions or concerns? Yes, Mom would like to follow up with Dr. Laural Benes about stomach concerns as well as injuries to head, arm, leg and concussion follow up.   SIGNATURE

## 2019-12-31 NOTE — Telephone Encounter (Signed)
Transition Care Management Unsuccessful Follow-up Telephone Call ° °Date of discharge and from where:  12/30/2019 [facility] ° °Attempts:  1st Attempt ° °Reason for unsuccessful TCM follow-up call:  Left voice message ° ° ° °

## 2020-01-01 NOTE — Telephone Encounter (Signed)
Patient has an appointment with [facility] on 01/07/2020 at 1:30pm. Closing encounter.

## 2020-01-02 DIAGNOSIS — M25572 Pain in left ankle and joints of left foot: Secondary | ICD-10-CM | POA: Diagnosis not present

## 2020-01-02 DIAGNOSIS — S63601A Unspecified sprain of right thumb, initial encounter: Secondary | ICD-10-CM | POA: Diagnosis not present

## 2020-01-02 DIAGNOSIS — M25562 Pain in left knee: Secondary | ICD-10-CM | POA: Diagnosis not present

## 2020-01-02 DIAGNOSIS — S93402A Sprain of unspecified ligament of left ankle, initial encounter: Secondary | ICD-10-CM | POA: Diagnosis not present

## 2020-01-07 ENCOUNTER — Other Ambulatory Visit: Payer: Self-pay

## 2020-01-07 ENCOUNTER — Ambulatory Visit (INDEPENDENT_AMBULATORY_CARE_PROVIDER_SITE_OTHER): Payer: Medicaid Other | Admitting: Pediatrics

## 2020-01-07 ENCOUNTER — Encounter: Payer: Self-pay | Admitting: Pediatrics

## 2020-01-07 VITALS — Wt 170.0 lb

## 2020-01-07 DIAGNOSIS — S060X0D Concussion without loss of consciousness, subsequent encounter: Secondary | ICD-10-CM

## 2020-01-08 ENCOUNTER — Other Ambulatory Visit: Payer: Self-pay | Admitting: Pediatrics

## 2020-01-08 ENCOUNTER — Telehealth: Payer: Self-pay | Admitting: Pediatrics

## 2020-01-08 ENCOUNTER — Encounter: Payer: Self-pay | Admitting: Pediatrics

## 2020-01-08 DIAGNOSIS — K219 Gastro-esophageal reflux disease without esophagitis: Secondary | ICD-10-CM

## 2020-01-08 MED ORDER — HYDROCODONE-ACETAMINOPHEN 5-325 MG PO TABS: 1.0000 | ORAL_TABLET | Freq: Four times a day (QID) | ORAL | 0 refills | Status: AC | PRN

## 2020-01-08 MED ORDER — LANSOPRAZOLE 30 MG PO CPDR: 30.0000 mg | DELAYED_RELEASE_CAPSULE | Freq: Two times a day (BID) | ORAL | 3 refills | Status: DC

## 2020-01-08 NOTE — Telephone Encounter (Signed)
Mom says you were supposed to call in some meds for pt yesterday but they are not there. Pharmacy is [facility] on [address]
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
## 2020-01-08 NOTE — Telephone Encounter (Signed)
Thanks

## 2020-01-08 NOTE — Telephone Encounter (Signed)
Thanks yes. They are there now.

## 2020-01-14 ENCOUNTER — Telehealth: Payer: Self-pay

## 2020-01-14 NOTE — Telephone Encounter (Signed)
Tc patient needs stitches remove from head, before the week is out, can that be done here? Will get patient scheduled accordingly if so.

## 2020-01-17 ENCOUNTER — Other Ambulatory Visit: Payer: Self-pay

## 2020-01-17 ENCOUNTER — Ambulatory Visit: Payer: Self-pay

## 2020-01-17 ENCOUNTER — Ambulatory Visit
Admission: EM | Admit: 2020-01-17 | Discharge: 2020-01-17 | Disposition: A | Discharge status: Home / Self Care | Payer: BLUE CROSS/BLUE SHIELD | Attending: Family Medicine | Admitting: Family Medicine

## 2020-01-17 ENCOUNTER — Encounter: Payer: Self-pay | Admitting: Emergency Medicine

## 2020-01-17 DIAGNOSIS — M79605 Pain in left leg: Secondary | ICD-10-CM

## 2020-01-17 MED ORDER — HYDROCODONE-ACETAMINOPHEN 5-325 MG PO TABS: 1.0000 | ORAL_TABLET | Freq: Four times a day (QID) | ORAL | 0 refills | Status: AC | PRN
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
## 2020-01-17 NOTE — Discharge Instructions (Addendum)
I have sent in Norco for you to take one tablet every 6 hours as needed for pain  Follow  up with ortho next week  Follow up with this office or with primary care if symptoms are persisting.  Follow up in the ER for high fever, trouble swallowing, trouble breathing, other concerning symptoms.
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
## 2020-01-22 DIAGNOSIS — M25562 Pain in left knee: Secondary | ICD-10-CM | POA: Diagnosis not present

## 2020-01-25 DIAGNOSIS — M79641 Pain in right hand: Secondary | ICD-10-CM | POA: Diagnosis not present

## 2020-01-31 ENCOUNTER — Ambulatory Visit: Payer: Self-pay

## 2020-02-01 ENCOUNTER — Ambulatory Visit
Admission: RE | Admit: 2020-02-01 | Discharge: 2020-02-01 | Disposition: A | Discharge status: Home / Self Care | Payer: Medicaid Other | Source: Ambulatory Visit | Attending: Emergency Medicine | Admitting: Emergency Medicine

## 2020-02-01 ENCOUNTER — Other Ambulatory Visit: Payer: Self-pay

## 2020-02-01 VITALS — BP 133/80 | HR 102 | Temp 99.4°F | Resp 18 | Ht 76.0 in | Wt 170.0 lb

## 2020-02-01 DIAGNOSIS — J069 Acute upper respiratory infection, unspecified: Secondary | ICD-10-CM | POA: Diagnosis not present

## 2020-02-01 DIAGNOSIS — Z760 Encounter for issue of repeat prescription: Secondary | ICD-10-CM | POA: Diagnosis not present

## 2020-02-01 DIAGNOSIS — R52 Pain, unspecified: Secondary | ICD-10-CM

## 2020-02-01 DIAGNOSIS — R509 Fever, unspecified: Secondary | ICD-10-CM | POA: Diagnosis not present

## 2020-02-01 DIAGNOSIS — Z1152 Encounter for screening for COVID-19: Secondary | ICD-10-CM | POA: Diagnosis not present

## 2020-02-01 MED ORDER — METHOCARBAMOL 500 MG PO TABS: 500.0000 mg | ORAL_TABLET | Freq: Two times a day (BID) | ORAL | 0 refills | Status: DC | PRN

## 2020-02-01 MED ORDER — CETIRIZINE HCL 10 MG PO TABS: 10.0000 mg | ORAL_TABLET | Freq: Every day | ORAL | 0 refills | Status: DC

## 2020-02-01 MED ORDER — FLUTICASONE PROPIONATE 50 MCG/ACT NA SUSP: 1.0000 | Freq: Every day | NASAL | 0 refills | Status: DC

## 2020-02-01 MED ORDER — BENZONATATE 100 MG PO CAPS: 100.0000 mg | ORAL_CAPSULE | Freq: Three times a day (TID) | ORAL | 0 refills | Status: DC | PRN

## 2020-02-01 MED ORDER — DEXAMETHASONE 4 MG PO TABS: 4.0000 mg | ORAL_TABLET | Freq: Every day | ORAL | 0 refills | Status: AC

## 2020-02-01 NOTE — ED Provider Notes (Addendum)
[facility]   [id number] 02/01/20 Arrival Time: 1546   CC: COVID symptoms  SUBJECTIVE: History from: patient.  Chris Austin is a 20 y.o. male who presented to the urgent care for complaint of chills, fever, cough and body aches for the past 2 days.  Denies sick exposure to COVID, flu or strep.  Denies recent travel.  Has tried OTC medication without relief.  Denies alleviating or aggravating factors.  Denies previous symptoms in the past.   Denies fever, chills, fatigue, sinus pain, rhinorrhea, sore throat, SOB, wheezing, chest pain, nausea, changes in bowel or bladder habits.     Patient will also like to have Robaxin refilled  ROS: As per HPI.  All other pertinent ROS negative.      Past Medical History:  Diagnosis Date  . Unspecified asthma(493.90) 08/23/2012   Past Surgical History:  Procedure Laterality Date  . WISDOM TOOTH EXTRACTION     Allergies  Allergen Reactions  . Amoxil [Amoxicillin] Hives   No current facility-administered medications on file prior to encounter.   Current Outpatient Medications on File Prior to Encounter  Medication Sig Dispense Refill  . albuterol (PROAIR HFA) 108 (90 Base) MCG/ACT inhaler 2 puffs every 4 to 6 hours as needed for wheezing or coughing or shortness or breath 18 g 1  . albuterol (PROVENTIL HFA;VENTOLIN HFA) 108 (90 Base) MCG/ACT inhaler Inhale 2 puffs into the lungs every 4 (four) hours as needed for wheezing (keep on at school). Take 4 puffs every 4 hours for the first 24 hours 2 Inhaler 1  . chlorhexidine (PERIDEX) 0.12 % solution Use as directed 15 mLs in the mouth or throat 3 (three) times daily after meals. 120 mL 0  . FLOVENT HFA 110 MCG/ACT inhaler One puff twice a day for asthma, brush teeth after using Flovent 1 Inhaler 5  . hydrocortisone 2.5 % cream APPLY TO ECZEMA TWICE DAILY FOR 1 WEEK AS NEEDED 120 g 0  . lansoprazole (PREVACID) 30 MG capsule Take 1 capsule (30 mg total) by mouth 2 (two) times daily  before a meal. Dispense generic for insurance. 60 capsule 3  . loratadine (CLARITIN) 10 MG tablet Take 1 tablet (10 mg total) by mouth daily. 30 tablet 11  . meloxicam (MOBIC) 7.5 MG tablet Take 1 tablet (7.5 mg total) by mouth daily. 30 tablet 0  . montelukast (SINGULAIR) 10 MG tablet Take 1 tablet (10 mg total) by mouth at bedtime. 30 tablet 11  . naproxen (NAPROSYN) 500 MG tablet Take 1 tablet (500 mg total) by mouth 2 (two) times daily. 30 tablet 0  . PROAIR HFA 108 (90 Base) MCG/ACT inhaler Inhale 2 puffs into the lungs every 4 (four) hours as needed for wheezing or shortness of breath.    . SUMAtriptan (IMITREX) 25 MG tablet Take 1 tablet (25 mg total) by mouth 2 (two) times daily as needed for up to 9 days for migraine. May repeat in 2 hours if headache persists or recurs. 10 tablet 0  . traZODone (DESYREL) 100 MG tablet Take 100 mg by mouth at bedtime.     Social History   Socioeconomic History  . Marital status: Single    Spouse name: Not on file  . Number of children: Not on file  . Years of education: Not on file  . Highest education level: Not on file  Occupational History  . Not on file  Tobacco Use  . Smoking status: Passive Smoke Exposure - Never Smoker  .  Smokeless tobacco: Never Used  . Tobacco comment: stepfather smokes outside  Vaping Use  . Vaping Use: Never used  Substance and Sexual Activity  . Alcohol use: Not Currently  . Drug use: Never  . Sexual activity: Yes    Birth control/protection: Condom  Other Topics Concern  . Not on file  Social History Narrative   ** Merged History Encounter **       Lives with mother and stepfather     Social Determinants of Health   Financial Resource Strain: Not on file  Food Insecurity: Not on file  Transportation Needs: Not on file  Physical Activity: Not on file  Stress: Not on file  Social Connections: Not on file  Intimate Partner Violence: Not on file   Family History  Problem Relation Age of Onset  .  Asthma Maternal Grandmother   . Healthy Mother   . Healthy Father     OBJECTIVE:  Vitals:   02/01/20 1659 02/01/20 1700  BP: 133/80   Pulse: (!) 102   Resp: 18   Temp: 99.4 F (37.4 C)   TempSrc: Oral   SpO2: 99%   Weight:  170 lb (77.1 kg)  Height:  6\' 4"  (1.93 m)     General appearance: alert; appears fatigued, but nontoxic; speaking in full sentences and tolerating own secretions HEENT: NCAT; Ears: EACs clear, TMs pearly gray; Eyes: PERRL.  EOM grossly intact. Sinuses: nontender; Nose: nares patent without rhinorrhea, Throat: oropharynx clear, tonsils non erythematous or enlarged, uvula midline  Neck: supple without LAD Lungs: unlabored respirations, symmetrical air entry; cough: moderate; no respiratory distress; CTAB Heart: regular rate and rhythm.  Radial pulses 2+ symmetrical bilaterally Skin: warm and dry Psychological: alert and cooperative; normal mood and affect  LABS:  No results found for this or any previous visit (from the past 24 hour(s)).   ASSESSMENT & PLAN:  1. Encounter for screening for COVID-19   2. Viral URI with cough   3. Chills with fever   4. Body aches   5. Medication refill     Meds ordered this encounter  Medications  . cetirizine (ZYRTEC ALLERGY) 10 MG tablet    Sig: Take 1 tablet (10 mg total) by mouth daily.    Dispense:  30 tablet    Refill:  0  . fluticasone (FLONASE) 50 MCG/ACT nasal spray    Sig: Place 1 spray into both nostrils daily for 14 days.    Dispense:  16 g    Refill:  0  . dexamethasone (DECADRON) 4 MG tablet    Sig: Take 1 tablet (4 mg total) by mouth daily for 4 days.    Dispense:  4 tablet    Refill:  0  . methocarbamol (ROBAXIN) 500 MG tablet    Sig: Take 1 tablet (500 mg total) by mouth every 12 (twelve) hours as needed for muscle spasms.    Dispense:  10 tablet    Refill:  0    Discharge instructions  COVID testing ordered.  It will take between 2-7 days for test results.  Someone will contact you  regarding abnormal results.     Get plenty of rest and push fluids Tessalon Perles prescribed for cough Zyrtec for nasal congestion, runny nose, and/or sore throat Decadron was prescribed Robaxin was refilled Use medications daily for symptom relief Use OTC medications like ibuprofen or tylenol as needed fever or pain Call or go to the ED if you have any new or worsening symptoms  such as fever, worsening cough, shortness of breath, chest tightness, chest pain, turning blue, changes in mental status, etc...   Reviewed expectations re: course of current medical issues. Questions answered. Outlined signs and symptoms indicating need for more acute intervention. Patient verbalized understanding. After Visit Summary given.         Durward Parcel, FNP 02/01/20 1746    Durward Parcel, FNP 02/01/20 1751

## 2020-02-01 NOTE — ED Triage Notes (Signed)
Chills, body aches, cough x 2 days.

## 2020-02-01 NOTE — Discharge Instructions (Signed)
COVID testing ordered.  It will take between 2-7 days for test results.  Someone will contact you regarding abnormal results.     Get plenty of rest and push fluids Tessalon Perles prescribed for cough Zyrtec for nasal congestion, runny nose, and/or sore throat Decadron was prescribed Use medications daily for symptom relief Use OTC medications like ibuprofen or tylenol as needed fever or pain Call or go to the ED if you have any new or worsening symptoms such as fever, worsening cough, shortness of breath, chest tightness, chest pain, turning blue, changes in mental status, etc..

## 2020-02-07 LAB — COVID-19, FLU A+B NAA
Influenza A, NAA: NOT DETECTED
Influenza B, NAA: NOT DETECTED
SARS-CoV-2, NAA: DETECTED — AB

## 2020-02-20 DIAGNOSIS — M79641 Pain in right hand: Secondary | ICD-10-CM | POA: Diagnosis not present

## 2020-02-20 DIAGNOSIS — M25572 Pain in left ankle and joints of left foot: Secondary | ICD-10-CM | POA: Diagnosis not present

## 2020-03-04 IMAGING — DX DG ABDOMEN 2V
3 series · 3 of 3 positions shown · non-contrast
Comparison: None.

CLINICAL DATA: Left-sided abdominal pain

EXAM:
ABDOMEN - 2 VIEW

[abdomen erect]
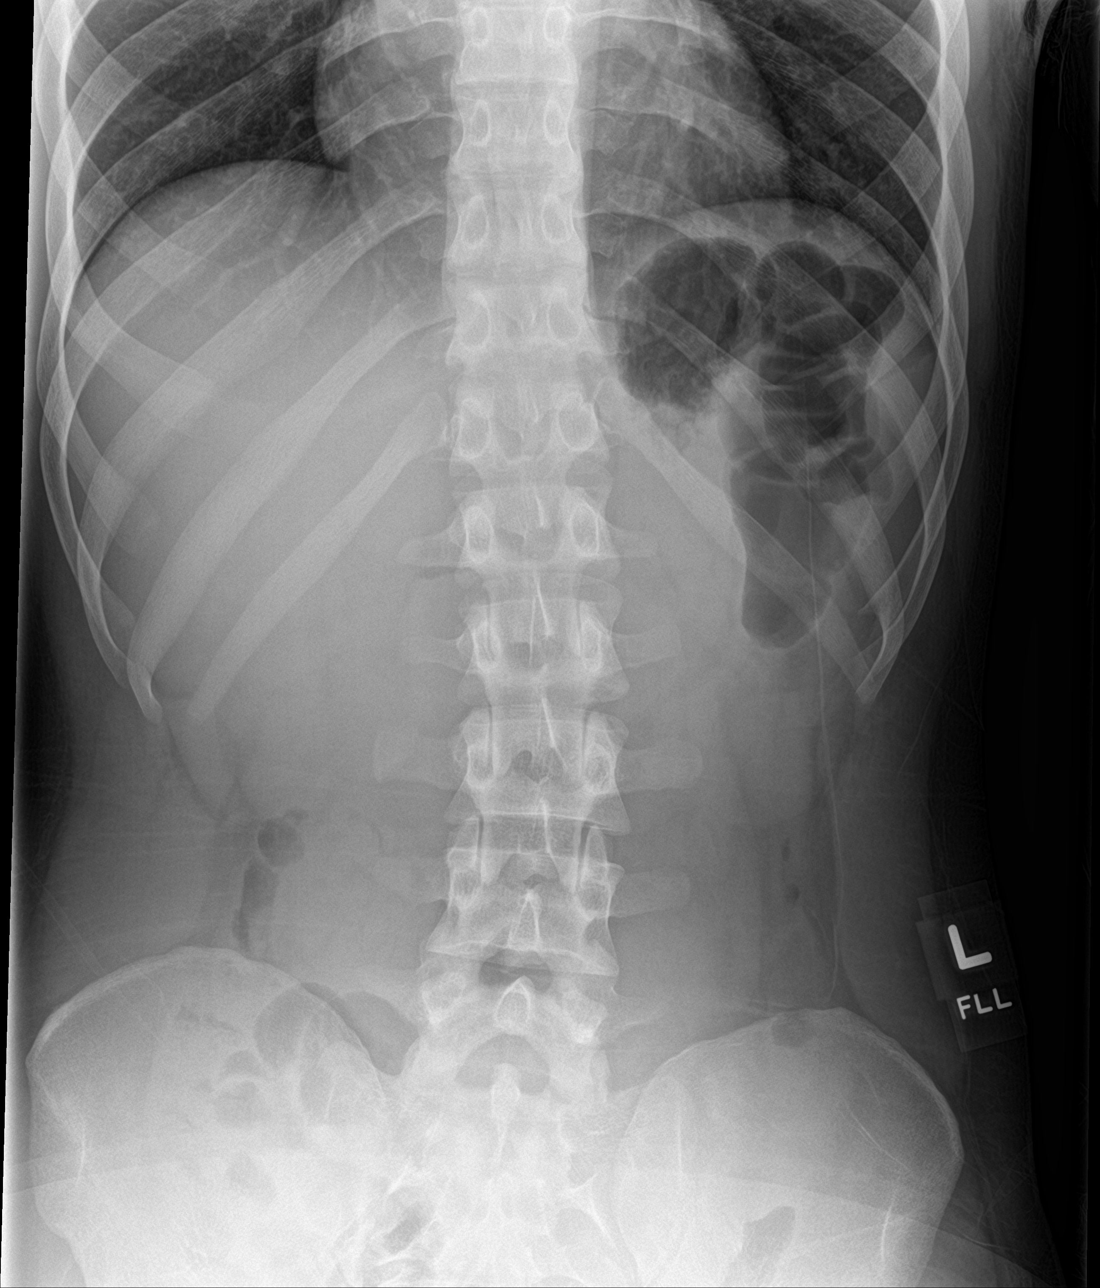

[abdomen supine (1 of 2)]
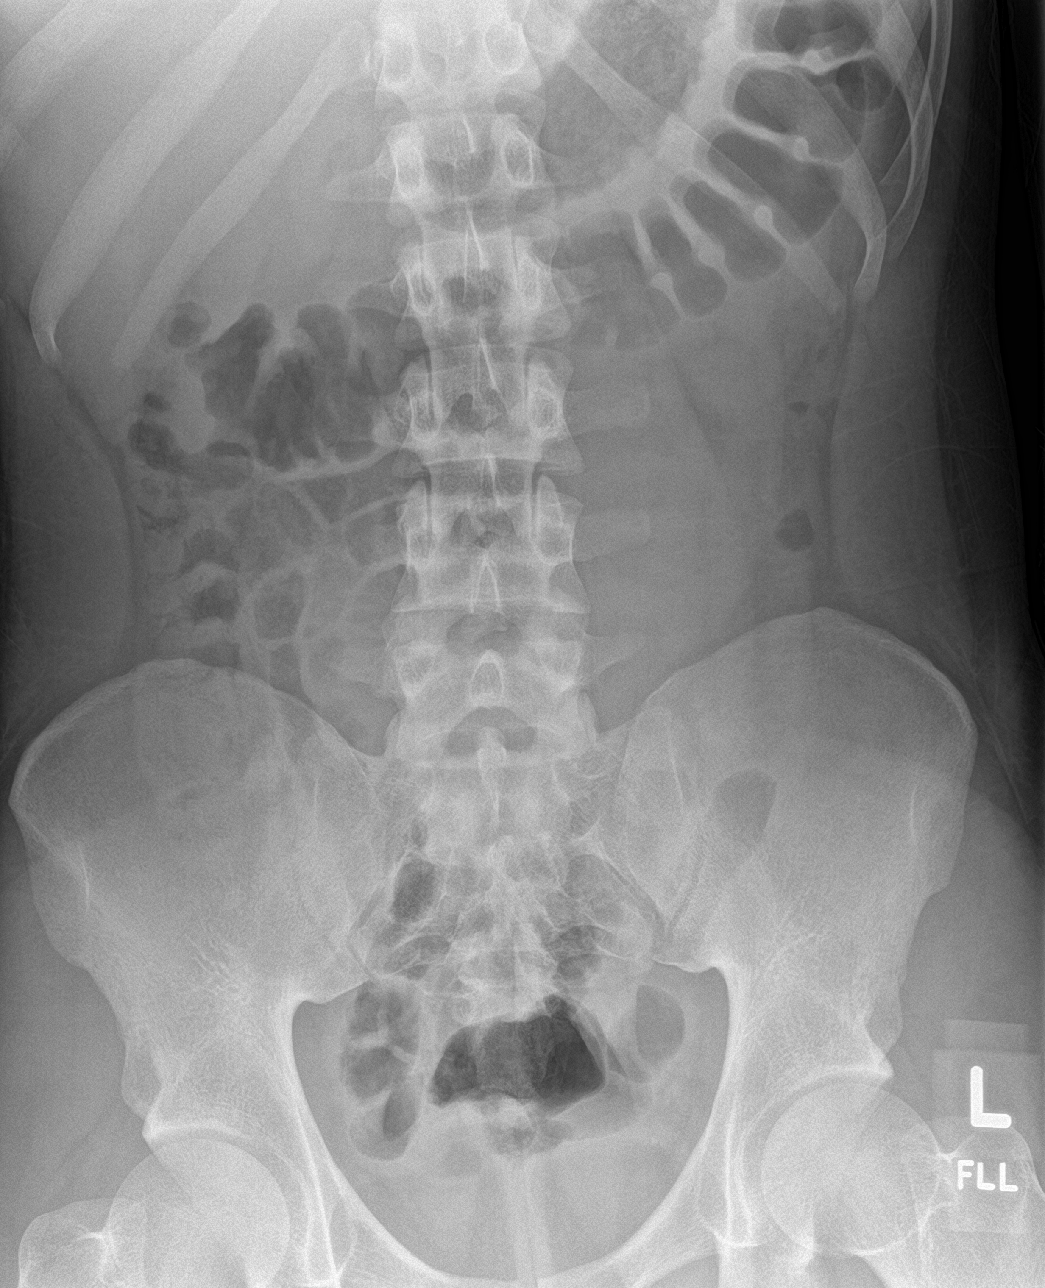

[abdomen supine (2 of 2)]
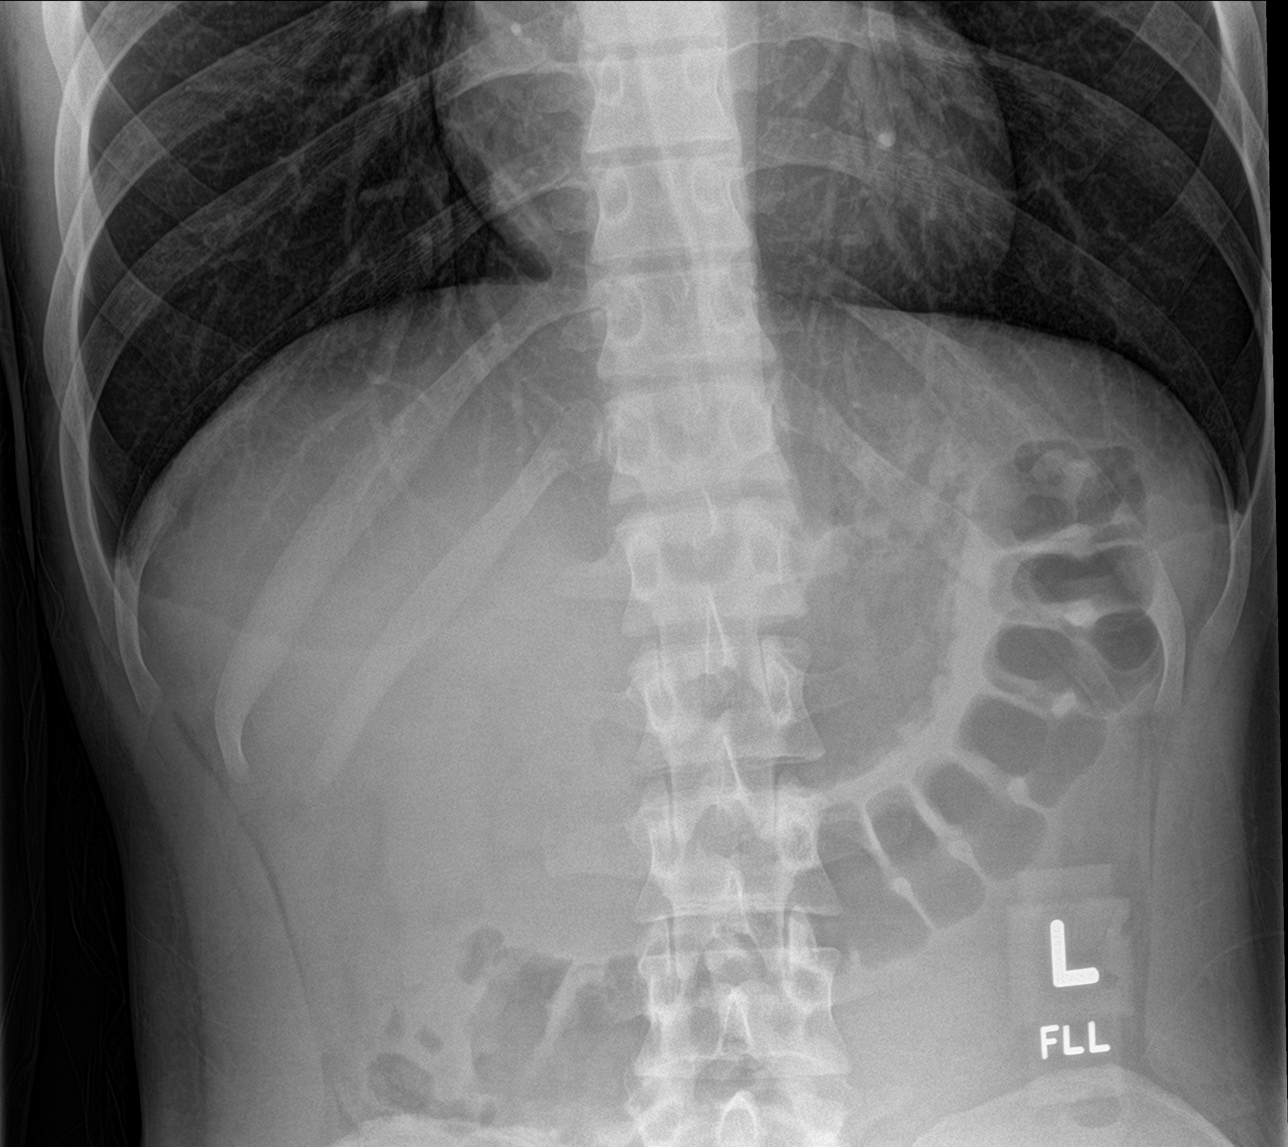

[3 of 3 positions shown; findings below may reference images not displayed]

FINDINGS: The bowel gas pattern is normal. There is no evidence of free air.
No radio-opaque calculi or other significant radiographic
abnormality is seen. Minimal scoliosis of the spine, apex left.
IMPRESSION: Negative.

## 2020-03-12 ENCOUNTER — Ambulatory Visit (HOSPITAL_COMMUNITY): Payer: Medicaid Other | Admitting: Physical Therapy

## 2020-03-19 ENCOUNTER — Ambulatory Visit (HOSPITAL_COMMUNITY): Payer: Medicaid Other | Attending: Orthopedic Surgery | Admitting: Physical Therapy

## 2020-03-19 ENCOUNTER — Other Ambulatory Visit: Payer: Self-pay

## 2020-03-19 ENCOUNTER — Encounter (HOSPITAL_COMMUNITY): Payer: Self-pay | Admitting: Physical Therapy

## 2020-03-19 DIAGNOSIS — M25562 Pain in left knee: Secondary | ICD-10-CM | POA: Insufficient documentation

## 2020-03-19 DIAGNOSIS — R29898 Other symptoms and signs involving the musculoskeletal system: Secondary | ICD-10-CM | POA: Insufficient documentation

## 2020-03-19 DIAGNOSIS — M25572 Pain in left ankle and joints of left foot: Secondary | ICD-10-CM | POA: Diagnosis not present

## 2020-03-19 DIAGNOSIS — R2689 Other abnormalities of gait and mobility: Secondary | ICD-10-CM | POA: Diagnosis not present

## 2020-03-19 DIAGNOSIS — M6281 Muscle weakness (generalized): Secondary | ICD-10-CM | POA: Diagnosis not present
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
## 2020-03-19 NOTE — Patient Instructions (Signed)
Access Code: [email or web address] URL: [email or web address] Date: 03/19/2020 Prepared by: Greig Castilla Marcheta Horsey  Exercises Seated Calf Stretch with Strap - 3 x daily - 7 x weekly - 3 reps - 30 seconds hold Side to Side Weight Shift with Counter Support - 3 x daily - 7 x weekly - 3 minutes hold

## 2020-04-02 ENCOUNTER — Ambulatory Visit (HOSPITAL_COMMUNITY): Payer: Medicaid Other | Admitting: Physical Therapy

## 2020-04-02 ENCOUNTER — Other Ambulatory Visit: Payer: Self-pay

## 2020-04-02 DIAGNOSIS — R2689 Other abnormalities of gait and mobility: Secondary | ICD-10-CM

## 2020-04-02 DIAGNOSIS — M6281 Muscle weakness (generalized): Secondary | ICD-10-CM

## 2020-04-02 DIAGNOSIS — R29898 Other symptoms and signs involving the musculoskeletal system: Secondary | ICD-10-CM

## 2020-04-02 DIAGNOSIS — M25572 Pain in left ankle and joints of left foot: Secondary | ICD-10-CM

## 2020-04-02 DIAGNOSIS — M25562 Pain in left knee: Secondary | ICD-10-CM | POA: Diagnosis not present

## 2020-04-02 NOTE — Patient Instructions (Signed)
Access Code: [email or web address] URL: [email or web address] Date: 04/02/2020 Prepared by: Georges Lynch  Exercises Seated Ankle Eversion with Resistance - 2 x daily - 7 x weekly - 2 sets - 10 reps Seated Ankle Dorsiflexion with Resistance - 2 x daily - 7 x weekly - 2 sets - 10 reps Seated Ankle Plantar Flexion with Resistance Loop - 2 x daily - 7 x weekly - 2 sets - 10 reps Seated Ankle Inversion with Resistance - 2 x daily - 7 x weekly - 2 sets - 10 reps Supine Quad Set - 2 x daily - 7 x weekly - 1 sets - 10 reps - 5 second hold Active Straight Leg Raise with Quad Set - 2 x daily - 7 x weekly - 2 sets - 10 reps Supine Heel Slide - 2 x daily - 7 x weekly - 2 sets - 10 reps Supine Bridge - 2 x daily - 7 x weekly - 2 sets - 10 reps Stride Stance Weight Shift - 2 x daily - 7 x weekly - 1 sets - 20 reps - second hold
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
## 2020-04-04 ENCOUNTER — Other Ambulatory Visit: Payer: Self-pay

## 2020-04-04 ENCOUNTER — Ambulatory Visit (HOSPITAL_COMMUNITY): Payer: Medicaid Other

## 2020-04-04 ENCOUNTER — Telehealth (HOSPITAL_COMMUNITY): Payer: Self-pay

## 2020-04-04 ENCOUNTER — Encounter (HOSPITAL_COMMUNITY): Payer: Self-pay

## 2020-04-04 DIAGNOSIS — R2689 Other abnormalities of gait and mobility: Secondary | ICD-10-CM | POA: Diagnosis not present

## 2020-04-04 DIAGNOSIS — M25562 Pain in left knee: Secondary | ICD-10-CM

## 2020-04-04 DIAGNOSIS — M25572 Pain in left ankle and joints of left foot: Secondary | ICD-10-CM

## 2020-04-04 DIAGNOSIS — R29898 Other symptoms and signs involving the musculoskeletal system: Secondary | ICD-10-CM

## 2020-04-04 DIAGNOSIS — M6281 Muscle weakness (generalized): Secondary | ICD-10-CM
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
## 2020-04-07 ENCOUNTER — Other Ambulatory Visit: Payer: Self-pay

## 2020-04-07 ENCOUNTER — Ambulatory Visit (HOSPITAL_COMMUNITY): Payer: Medicaid Other

## 2020-04-07 DIAGNOSIS — R2689 Other abnormalities of gait and mobility: Secondary | ICD-10-CM

## 2020-04-07 DIAGNOSIS — M25562 Pain in left knee: Secondary | ICD-10-CM | POA: Diagnosis not present

## 2020-04-07 DIAGNOSIS — M6281 Muscle weakness (generalized): Secondary | ICD-10-CM

## 2020-04-07 DIAGNOSIS — M25572 Pain in left ankle and joints of left foot: Secondary | ICD-10-CM | POA: Diagnosis not present

## 2020-04-07 DIAGNOSIS — R29898 Other symptoms and signs involving the musculoskeletal system: Secondary | ICD-10-CM | POA: Diagnosis not present
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
## 2020-04-09 ENCOUNTER — Ambulatory Visit (HOSPITAL_COMMUNITY): Payer: Medicaid Other

## 2020-04-09 ENCOUNTER — Encounter (HOSPITAL_COMMUNITY): Payer: Self-pay

## 2020-04-09 ENCOUNTER — Other Ambulatory Visit: Payer: Self-pay

## 2020-04-09 DIAGNOSIS — R29898 Other symptoms and signs involving the musculoskeletal system: Secondary | ICD-10-CM | POA: Diagnosis not present

## 2020-04-09 DIAGNOSIS — M6281 Muscle weakness (generalized): Secondary | ICD-10-CM | POA: Diagnosis not present

## 2020-04-09 DIAGNOSIS — M25562 Pain in left knee: Secondary | ICD-10-CM

## 2020-04-09 DIAGNOSIS — M25572 Pain in left ankle and joints of left foot: Secondary | ICD-10-CM | POA: Diagnosis not present

## 2020-04-09 DIAGNOSIS — R2689 Other abnormalities of gait and mobility: Secondary | ICD-10-CM | POA: Diagnosis not present
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
## 2020-04-14 ENCOUNTER — Telehealth (HOSPITAL_COMMUNITY): Payer: Self-pay | Admitting: Physical Therapy

## 2020-04-14 ENCOUNTER — Ambulatory Visit (HOSPITAL_COMMUNITY): Payer: Medicaid Other | Admitting: Physical Therapy

## 2020-04-14 NOTE — Telephone Encounter (Signed)
Pt did not show for appointment this afternoon.  Spoke to mom who reports they had a death in the family and forgot to call and cancel.  Reminded of next appointment 3/30 at 4pm.   Lurena Nida, PTA/CLT [phone]

## 2020-04-16 ENCOUNTER — Ambulatory Visit (HOSPITAL_COMMUNITY): Payer: Medicaid Other

## 2020-04-16 ENCOUNTER — Encounter (HOSPITAL_COMMUNITY): Payer: Self-pay

## 2020-04-16 ENCOUNTER — Other Ambulatory Visit: Payer: Self-pay

## 2020-04-16 DIAGNOSIS — M25572 Pain in left ankle and joints of left foot: Secondary | ICD-10-CM | POA: Diagnosis not present

## 2020-04-16 DIAGNOSIS — R2689 Other abnormalities of gait and mobility: Secondary | ICD-10-CM | POA: Diagnosis not present

## 2020-04-16 DIAGNOSIS — R29898 Other symptoms and signs involving the musculoskeletal system: Secondary | ICD-10-CM | POA: Diagnosis not present

## 2020-04-16 DIAGNOSIS — M25562 Pain in left knee: Secondary | ICD-10-CM

## 2020-04-16 DIAGNOSIS — M6281 Muscle weakness (generalized): Secondary | ICD-10-CM | POA: Diagnosis not present
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
## 2020-04-21 ENCOUNTER — Ambulatory Visit (HOSPITAL_COMMUNITY): Payer: Medicaid Other | Attending: Orthopedic Surgery | Admitting: Physical Therapy

## 2020-04-21 ENCOUNTER — Telehealth (HOSPITAL_COMMUNITY): Payer: Self-pay | Admitting: Physical Therapy

## 2020-04-21 DIAGNOSIS — M6281 Muscle weakness (generalized): Secondary | ICD-10-CM | POA: Insufficient documentation

## 2020-04-21 DIAGNOSIS — R2689 Other abnormalities of gait and mobility: Secondary | ICD-10-CM | POA: Insufficient documentation

## 2020-04-21 DIAGNOSIS — M25531 Pain in right wrist: Secondary | ICD-10-CM | POA: Insufficient documentation

## 2020-04-21 DIAGNOSIS — R29898 Other symptoms and signs involving the musculoskeletal system: Secondary | ICD-10-CM | POA: Insufficient documentation

## 2020-04-21 DIAGNOSIS — R278 Other lack of coordination: Secondary | ICD-10-CM | POA: Insufficient documentation

## 2020-04-21 DIAGNOSIS — M25631 Stiffness of right wrist, not elsewhere classified: Secondary | ICD-10-CM | POA: Insufficient documentation

## 2020-04-21 DIAGNOSIS — M25572 Pain in left ankle and joints of left foot: Secondary | ICD-10-CM | POA: Insufficient documentation

## 2020-04-21 DIAGNOSIS — M25562 Pain in left knee: Secondary | ICD-10-CM | POA: Insufficient documentation

## 2020-04-21 NOTE — Telephone Encounter (Signed)
Pt did not show for 4:15pm PT appointment.  Pt also had a 5:30 OT appt today.  Called and left voicemail regarding missed appt and reminded of next appt on Wednesday, 4/6 at 4pm.  Lurena Nida, PTA/CLT [phone]

## 2020-04-22 ENCOUNTER — Encounter (HOSPITAL_COMMUNITY): Payer: Self-pay

## 2020-04-22 ENCOUNTER — Other Ambulatory Visit: Payer: Self-pay

## 2020-04-22 ENCOUNTER — Ambulatory Visit (HOSPITAL_COMMUNITY): Payer: Medicaid Other

## 2020-04-22 DIAGNOSIS — M25631 Stiffness of right wrist, not elsewhere classified: Secondary | ICD-10-CM

## 2020-04-22 DIAGNOSIS — M25531 Pain in right wrist: Secondary | ICD-10-CM | POA: Diagnosis not present

## 2020-04-22 DIAGNOSIS — R278 Other lack of coordination: Secondary | ICD-10-CM | POA: Diagnosis not present

## 2020-04-22 DIAGNOSIS — M6281 Muscle weakness (generalized): Secondary | ICD-10-CM | POA: Diagnosis not present

## 2020-04-22 DIAGNOSIS — R2689 Other abnormalities of gait and mobility: Secondary | ICD-10-CM | POA: Diagnosis not present

## 2020-04-22 DIAGNOSIS — M25572 Pain in left ankle and joints of left foot: Secondary | ICD-10-CM | POA: Diagnosis not present

## 2020-04-22 DIAGNOSIS — M25562 Pain in left knee: Secondary | ICD-10-CM | POA: Diagnosis not present

## 2020-04-22 DIAGNOSIS — R29898 Other symptoms and signs involving the musculoskeletal system: Secondary | ICD-10-CM

## 2020-04-22 NOTE — Patient Instructions (Signed)
Theraputty Home Exercise Program  Complete 1-2 times a day.  putty squeeze  Pt. should squeeze putty in hand trying to keep it round by rotating putty after each squeeze. push fingers through putty to palm each time. Complete for ____3-5__ minutes.   PUTTY KEY GRIP  Hold the putty at the top of your hand. Squeeze the putty between your thumb and the side of your 2nd finger as shown. Complete for __3-5______ minutes.    PUTTY 3 JAW CHUCK  Roll up some putty into a ball then flatten it. Then, firmly squeeze it with your first 3 fingers as shown. Complete for _3-5____ minutes.         WRIST EXTENSOR STRETCH  Use your unaffected hand to bend the affected wrist down as shown.   Keep the elbow straight on the affected side the entire time.   Hold for 20-30 seconds. Complete 2 times.

## 2020-04-23 ENCOUNTER — Ambulatory Visit (HOSPITAL_COMMUNITY): Payer: Medicaid Other
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
## 2020-04-24 ENCOUNTER — Ambulatory Visit (HOSPITAL_COMMUNITY): Payer: Medicaid Other | Admitting: Physical Therapy

## 2020-04-24 ENCOUNTER — Encounter (HOSPITAL_COMMUNITY): Payer: Self-pay | Admitting: Physical Therapy

## 2020-04-24 ENCOUNTER — Ambulatory Visit (HOSPITAL_COMMUNITY): Payer: Medicaid Other

## 2020-04-24 ENCOUNTER — Other Ambulatory Visit: Payer: Self-pay

## 2020-04-24 DIAGNOSIS — R2689 Other abnormalities of gait and mobility: Secondary | ICD-10-CM

## 2020-04-24 DIAGNOSIS — R29898 Other symptoms and signs involving the musculoskeletal system: Secondary | ICD-10-CM

## 2020-04-24 DIAGNOSIS — M25572 Pain in left ankle and joints of left foot: Secondary | ICD-10-CM

## 2020-04-24 DIAGNOSIS — M25562 Pain in left knee: Secondary | ICD-10-CM

## 2020-04-24 DIAGNOSIS — R278 Other lack of coordination: Secondary | ICD-10-CM | POA: Diagnosis not present

## 2020-04-24 DIAGNOSIS — M6281 Muscle weakness (generalized): Secondary | ICD-10-CM | POA: Diagnosis not present

## 2020-04-24 DIAGNOSIS — M25531 Pain in right wrist: Secondary | ICD-10-CM | POA: Diagnosis not present

## 2020-04-24 DIAGNOSIS — M25631 Stiffness of right wrist, not elsewhere classified: Secondary | ICD-10-CM | POA: Diagnosis not present
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
## 2020-04-28 ENCOUNTER — Ambulatory Visit (HOSPITAL_COMMUNITY): Payer: Medicaid Other

## 2020-04-28 ENCOUNTER — Other Ambulatory Visit: Payer: Self-pay

## 2020-04-28 DIAGNOSIS — M25631 Stiffness of right wrist, not elsewhere classified: Secondary | ICD-10-CM | POA: Diagnosis not present

## 2020-04-28 DIAGNOSIS — M6281 Muscle weakness (generalized): Secondary | ICD-10-CM

## 2020-04-28 DIAGNOSIS — R2689 Other abnormalities of gait and mobility: Secondary | ICD-10-CM

## 2020-04-28 DIAGNOSIS — R29898 Other symptoms and signs involving the musculoskeletal system: Secondary | ICD-10-CM

## 2020-04-28 DIAGNOSIS — R278 Other lack of coordination: Secondary | ICD-10-CM | POA: Diagnosis not present

## 2020-04-28 DIAGNOSIS — M25531 Pain in right wrist: Secondary | ICD-10-CM | POA: Diagnosis not present

## 2020-04-28 DIAGNOSIS — M25562 Pain in left knee: Secondary | ICD-10-CM

## 2020-04-28 DIAGNOSIS — M25572 Pain in left ankle and joints of left foot: Secondary | ICD-10-CM
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
## 2020-04-29 ENCOUNTER — Ambulatory Visit (HOSPITAL_COMMUNITY): Payer: Medicaid Other

## 2020-04-30 ENCOUNTER — Ambulatory Visit (HOSPITAL_COMMUNITY): Payer: Medicaid Other | Admitting: Physical Therapy

## 2020-04-30 ENCOUNTER — Other Ambulatory Visit: Payer: Self-pay

## 2020-04-30 ENCOUNTER — Encounter (HOSPITAL_COMMUNITY): Payer: Self-pay | Admitting: Physical Therapy

## 2020-04-30 DIAGNOSIS — M25631 Stiffness of right wrist, not elsewhere classified: Secondary | ICD-10-CM | POA: Diagnosis not present

## 2020-04-30 DIAGNOSIS — M25562 Pain in left knee: Secondary | ICD-10-CM

## 2020-04-30 DIAGNOSIS — R2689 Other abnormalities of gait and mobility: Secondary | ICD-10-CM | POA: Diagnosis not present

## 2020-04-30 DIAGNOSIS — R29898 Other symptoms and signs involving the musculoskeletal system: Secondary | ICD-10-CM | POA: Diagnosis not present

## 2020-04-30 DIAGNOSIS — M6281 Muscle weakness (generalized): Secondary | ICD-10-CM

## 2020-04-30 DIAGNOSIS — M25572 Pain in left ankle and joints of left foot: Secondary | ICD-10-CM | POA: Diagnosis not present

## 2020-04-30 DIAGNOSIS — M25531 Pain in right wrist: Secondary | ICD-10-CM | POA: Diagnosis not present

## 2020-04-30 DIAGNOSIS — R278 Other lack of coordination: Secondary | ICD-10-CM | POA: Diagnosis not present

## 2020-05-01 ENCOUNTER — Encounter (HOSPITAL_COMMUNITY): Payer: Medicaid Other
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
## 2020-05-01 NOTE — Progress Notes (Signed)
   04/30/20 0001  Assessment  Medical Diagnosis Lt ankle soft tissue injury and L knee significant osseous contusions  Referring Provider (PT) Margarita Rana MD  Onset Date/Surgical Date 12/30/19  ROM / Strength  AROM / PROM / Strength AROM;Strength  AROM  Left Knee Flexion 115  Left Knee Extension 0  AROM Assessment Site Knee  Right Knee Extension 0  Right Knee Flexion 135  Right Ankle Dorsiflexion 15  Right Ankle Plantar Flexion 45  Left Ankle Dorsiflexion 8  Left Ankle Plantar Flexion 38  Strength  Left Knee Extension 4+/5  Right Hip Flexion 5/5  Left Hip Flexion 4+/5  Right Knee Extension 5/5  Left Ankle Dorsiflexion 5/5  Strength Assessment Site Hip;Knee;Ankle  Right Hip Extension 4+/5  Left Hip Extension 4/5  Left Hip ABduction 4+/5  Right Hip ABduction 4+/5  Left Ankle Inversion 4/5  Left Ankle Eversion 5/5  Balance  Balance Assessed Yes  Static Standing Balance  Static Standing Balance -  Activities  Single Leg Stance - Right Leg;Single Leg Stance - Left Leg  Static Standing - Comment/# of Minutes 60 sec, 17 sec mod sway
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
## 2020-05-06 ENCOUNTER — Ambulatory Visit (HOSPITAL_COMMUNITY): Payer: Medicaid Other

## 2020-05-06 ENCOUNTER — Other Ambulatory Visit: Payer: Self-pay

## 2020-05-06 DIAGNOSIS — R2689 Other abnormalities of gait and mobility: Secondary | ICD-10-CM | POA: Diagnosis not present

## 2020-05-06 DIAGNOSIS — M25631 Stiffness of right wrist, not elsewhere classified: Secondary | ICD-10-CM

## 2020-05-06 DIAGNOSIS — R29898 Other symptoms and signs involving the musculoskeletal system: Secondary | ICD-10-CM | POA: Diagnosis not present

## 2020-05-06 DIAGNOSIS — M25531 Pain in right wrist: Secondary | ICD-10-CM | POA: Diagnosis not present

## 2020-05-06 DIAGNOSIS — M25562 Pain in left knee: Secondary | ICD-10-CM | POA: Diagnosis not present

## 2020-05-06 DIAGNOSIS — M25572 Pain in left ankle and joints of left foot: Secondary | ICD-10-CM | POA: Diagnosis not present

## 2020-05-06 DIAGNOSIS — R278 Other lack of coordination: Secondary | ICD-10-CM

## 2020-05-06 DIAGNOSIS — M6281 Muscle weakness (generalized): Secondary | ICD-10-CM | POA: Diagnosis not present

## 2020-05-06 NOTE — Patient Instructions (Signed)
Complete 1-2 times a day. Complete 10 reps.   WRIST FLEXION CURLS - TABLE  Hold a small free weight / dumbbell, rest your forearm on a table and bend your wrist up and down with your palm face up as shown. (4lbs)     WRIST EXTENSION CURLS - TABLE  Hold a small free weight / dumbbell, rest your forearm on a table and bend your wrist up and down with your palm face down as shown. (3lbs)    WRIST CURLS - RADIAL DEVIATION - FREE WEIGHT - THIGH  While holding a small free weight / dumbbell and resting your forearm on your thigh, bend your wrist up and down with your wrist in a neutral position as shown. (3lbs)   Wrist Supination  Supporting forearm on a table and with palm facing down, rotate the wrist to palm up position.  Maintain forearm contact with the table. (3lbs)

## 2020-05-07 NOTE — Therapy (Signed)
[facility] [address] [place], [place], [address] Phone: [phone]   Fax:  [phone]  Occupational Therapy Treatment  Patient Details  Name: Chris Austin MRN: [id number] Date of Birth: [birth year] Referring Provider (OT): Margarita Rana, MD   Encounter Date: 05/06/2020   OT End of Session - 05/07/20 1347    Visit Number 2    Number of Visits 4    Date for OT Re-Evaluation 05/20/20    Authorization Type Medicaid [employer]    Authorization Time Period requesting 4 visits    OT Start Time 1652   pt arrived late   OT Stop Time 1730    OT Time Calculation (min) 38 min    Activity Tolerance Patient tolerated treatment well    Behavior During Therapy [facility] for tasks assessed/performed           Past Medical History:  Diagnosis Date  . Unspecified asthma(493.90) 08/23/2012    Past Surgical History:  Procedure Laterality Date  . WISDOM TOOTH EXTRACTION      There were no vitals filed for this visit.   Subjective Assessment - 05/07/20 1337    Subjective  S: It's gotten a lot better than it was. I've been working on it.    Pain Score 3     Pain Location Wrist    Pain Orientation Medial;Left    Pain Descriptors / Indicators Sore    Pain Type Acute pain    Pain Onset More than a month ago    Pain Frequency Intermittent    Aggravating Factors  stretching, increased use, gripping activities    Pain Relieving Factors rest, heat, ice    Effect of Pain on Daily Activities mod effect    Multiple Pain Sites No              [facility] OT Assessment - 05/06/20 1659      Assessment   Medical Diagnosis Right nondisplaced trapezium fracture      Precautions   Precautions Knee;Fall      AROM   AROM Assessment Site Wrist    Right Wrist Extension 70 Degrees   previous: same   Right Wrist Flexion 88 Degrees   previous: 58   Right Wrist Ulnar Deviation 46 Degrees   previous: 32     Strength   Right Hand Lateral Pinch 22 lbs    previous: 20   Right Hand 3 Point Pinch 18 lbs   previous: 16                   OT Treatments/Exercises (OP) - 05/06/20 1707      Exercises   Exercises Wrist;Hand      Wrist Exercises   Other wrist exercises Wrist flexion 4#, 10X, extension, radial deviation, supination/pronation, 10X, 3#;    Other wrist exercises wrist extension stretch at table 2x10"      Additional Wrist Exercises   Hand Gripper with Large Beads all beads with gripper set at 69# horizontal    Hand Gripper with Medium Beads all beads with gripper set at 69# horizontal    Hand Gripper with Small Beads all beads with gripper set at 55# horizontal                  OT Education - 05/07/20 1347    Education Details wrist strengthening with low weight (3-4lbs)    Person(s) Educated Patient    Methods Explanation;Demonstration;Handout    Comprehension Verbalized understanding;Returned  demonstration            OT Short Term Goals - 05/07/20 1804      OT SHORT TERM GOAL #1   Title patient will be provided and verbalize understanding of HEP in order to faciliate his progress in therapy and allow him to return to using his RUE as his dominant extremity for 50% or more of desired tasks.    Time 4    Period Weeks    Status On-going    Target Date 05/20/20      OT SHORT TERM GOAL #2   Title Patient will increase his right hand coordination by completing the 9 hole peg test in less than 20" in order to manipulate and pick up small items at home when completing daily tasks.    Time 4    Period Weeks    Status On-going      OT SHORT TERM GOAL #3   Title Patient will increase his right wrist strength to 5/5 overall while demonstrating and/or verbalizing improved stability and endurance when picking up and holding items of medium weight (5-10lbs).    Time 4    Period Weeks    Status On-going      OT SHORT TERM GOAL #4   Title Patient will increase his right wrist A/ROM by 10 degrees or more  where needed which will allow him to use his right UE for driving with increased comfort and less difficulty.    Time 4    Period Weeks    Status On-going      OT SHORT TERM GOAL #5   Title Pt will increase his right hand grip strength by 20# and his pinch strength to average norms for 3 point and lateral which will increase his legibility when writing and decrease hand fatigue overall.    Time 4    Period Weeks    Status On-going                    Plan - 05/07/20 1347    Clinical Impression Statement A: Initiated wrist strengthening and hand strengthening activities. HEP was updated. patient will see if he has access to hand weights (3-4lbs) at home. Reviewed goals. Updated measurements to assess weekly progress where needed. VC for form and technique were provided when needed. Pt voiced and demonstrated hand fatigue and rest breaks were taken as needed. Overall tolerated exercises with no increased pain.    Body Structure / Function / Physical Skills ADL;Endurance;UE functional use;Fascial restriction;Flexibility;Pain;FMC;Coordination;ROM;Mobility;Strength    Plan P: Follow up on updated HEP. If needed adjust to resistive bands if unable to obtain hand weights. Continue with pinch and grip strengthening. Increase ROM. (wrist extension/flexion bar)    OT Home Exercise Plan eval: red putty, wrist stretches 4/19: Wrist strengthening    Consulted and Agree with Plan of Care Patient           Patient will benefit from skilled therapeutic intervention in order to improve the following deficits and impairments:   Body Structure / Function / Physical Skills: ADL,Endurance,UE functional use,Fascial restriction,Flexibility,Pain,FMC,Coordination,ROM,Mobility,Strength       Visit Diagnosis: Other symptoms and signs involving the musculoskeletal system  Other lack of coordination  Stiffness of right wrist, not elsewhere classified  Pain in right wrist    Problem List Patient  Active Problem List   Diagnosis Date Noted  . Problems related to inappropriate diet and eating habits 02/01/2019  . Mild intermittent asthma with exacerbation  02/01/2019  . Gastroesophageal reflux disease 02/01/2019  . Eczema 10/12/2016  . Mild intermittent asthma without complication 10/12/2016  . Sleep disturbance 08/27/2014  . Mild persistent asthma without complication 08/27/2014  . Allergic rhinitis 03/28/2013  . Insomnia 03/28/2013    Limmie Patricia, OTR/L,CBIS  [phone]  05/07/2020, 6:07 PM  [facility] [address] [place], [place], [address] Phone: [phone]   Fax:  [phone]  Name: ERIQUE KASER MRN: [id number] Date of Birth: October 26, 2000

## 2020-05-08 ENCOUNTER — Encounter (HOSPITAL_COMMUNITY): Payer: Medicaid Other

## 2020-05-13 ENCOUNTER — Ambulatory Visit (HOSPITAL_COMMUNITY): Payer: Medicaid Other

## 2020-05-13 ENCOUNTER — Encounter (HOSPITAL_COMMUNITY): Payer: Self-pay

## 2020-05-15 ENCOUNTER — Ambulatory Visit (HOSPITAL_COMMUNITY): Payer: Medicaid Other | Admitting: Physical Therapy

## 2020-05-15 ENCOUNTER — Encounter (HOSPITAL_COMMUNITY): Payer: Medicaid Other

## 2020-05-22 ENCOUNTER — Other Ambulatory Visit: Payer: Self-pay

## 2020-05-22 ENCOUNTER — Ambulatory Visit (INDEPENDENT_AMBULATORY_CARE_PROVIDER_SITE_OTHER): Payer: Medicaid Other | Admitting: Internal Medicine

## 2020-05-22 ENCOUNTER — Encounter: Payer: Self-pay | Admitting: Internal Medicine

## 2020-05-22 VITALS — BP 117/69 | HR 88 | Resp 18 | Ht 76.0 in | Wt 179.4 lb

## 2020-05-22 DIAGNOSIS — S0990XA Unspecified injury of head, initial encounter: Secondary | ICD-10-CM | POA: Insufficient documentation

## 2020-05-22 DIAGNOSIS — G8929 Other chronic pain: Secondary | ICD-10-CM

## 2020-05-22 DIAGNOSIS — J4530 Mild persistent asthma, uncomplicated: Secondary | ICD-10-CM

## 2020-05-22 DIAGNOSIS — S82892D Other fracture of left lower leg, subsequent encounter for closed fracture with routine healing: Secondary | ICD-10-CM

## 2020-05-22 DIAGNOSIS — K219 Gastro-esophageal reflux disease without esophagitis: Secondary | ICD-10-CM

## 2020-05-22 DIAGNOSIS — Z1159 Encounter for screening for other viral diseases: Secondary | ICD-10-CM

## 2020-05-22 DIAGNOSIS — Z7689 Persons encountering health services in other specified circumstances: Secondary | ICD-10-CM

## 2020-05-22 DIAGNOSIS — J4521 Mild intermittent asthma with (acute) exacerbation: Secondary | ICD-10-CM

## 2020-05-22 DIAGNOSIS — S0990XD Unspecified injury of head, subsequent encounter: Secondary | ICD-10-CM | POA: Diagnosis not present

## 2020-05-22 DIAGNOSIS — J309 Allergic rhinitis, unspecified: Secondary | ICD-10-CM

## 2020-05-22 DIAGNOSIS — Z2821 Immunization not carried out because of patient refusal: Secondary | ICD-10-CM

## 2020-05-22 DIAGNOSIS — M25531 Pain in right wrist: Secondary | ICD-10-CM

## 2020-05-22 DIAGNOSIS — S82892A Other fracture of left lower leg, initial encounter for closed fracture: Secondary | ICD-10-CM | POA: Insufficient documentation

## 2020-05-22 MED ORDER — FLOVENT HFA 110 MCG/ACT IN AERO: INHALATION_SPRAY | RESPIRATORY_TRACT | 5 refills | Status: DC

## 2020-05-22 MED ORDER — FLUTICASONE PROPIONATE 50 MCG/ACT NA SUSP: 1.0000 | Freq: Every day | NASAL | 2 refills | Status: DC

## 2020-05-22 MED ORDER — LANSOPRAZOLE 30 MG PO CPDR: 30.0000 mg | DELAYED_RELEASE_CAPSULE | Freq: Two times a day (BID) | ORAL | 3 refills | Status: DC

## 2020-05-22 MED ORDER — ALBUTEROL SULFATE HFA 108 (90 BASE) MCG/ACT IN AERS: INHALATION_SPRAY | RESPIRATORY_TRACT | 1 refills | Status: DC

## 2020-05-22 MED ORDER — CETIRIZINE HCL 10 MG PO TABS: 10.0000 mg | ORAL_TABLET | Freq: Every day | ORAL | 5 refills | Status: DC

## 2020-05-22 NOTE — Assessment & Plan Note (Addendum)
Care established History and medications reviewed with the patient 
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
## 2020-05-22 NOTE — Assessment & Plan Note (Signed)
Well-controlled with Flovent and PRN Albuterol 

## 2020-05-22 NOTE — Assessment & Plan Note (Signed)
Well-controlled with Lansoprazole 30 mg BID Plan to switch to QD dosing later

## 2020-05-22 NOTE — Assessment & Plan Note (Signed)
Had Orthopedic surgery evaluation Currently undergoing physical therapy Continues to have pain and difficulty walking, advised to follow-up with orthopedic surgeon
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
## 2020-05-22 NOTE — Patient Instructions (Signed)
Please continue taking medications as prescribed.  Please contact your Orthopedic Surgeon for evaluation of persistent ankle and wrist pain.  Please avoid hot and spicy food.  Please get CT of the head done as scheduled.

## 2020-05-22 NOTE — Assessment & Plan Note (Signed)
Well-controlled with Zyrtec, refilled ?
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
## 2020-06-24 ENCOUNTER — Telehealth: Payer: Self-pay

## 2020-06-24 NOTE — Telephone Encounter (Signed)
Dani called from preservices at [facility] needs prior authorization for CT head for [facility].  [phone] ext [phone]

## 2020-06-25 ENCOUNTER — Ambulatory Visit (HOSPITAL_COMMUNITY): Payer: Medicaid Other

## 2020-06-25 NOTE — Telephone Encounter (Signed)
Prior authorization started for pt waiting response

## 2020-07-19 ENCOUNTER — Emergency Department (HOSPITAL_COMMUNITY)
Admission: EM | Admit: 2020-07-19 | Discharge: 2020-07-19 | Disposition: A | Discharge status: Home / Self Care | Payer: Medicaid Other | Attending: Emergency Medicine | Admitting: Emergency Medicine

## 2020-07-19 ENCOUNTER — Emergency Department (HOSPITAL_COMMUNITY): Payer: Medicaid Other

## 2020-07-19 ENCOUNTER — Other Ambulatory Visit: Payer: Self-pay

## 2020-07-19 DIAGNOSIS — S71101A Unspecified open wound, right thigh, initial encounter: Secondary | ICD-10-CM | POA: Diagnosis not present

## 2020-07-19 DIAGNOSIS — S71001A Unspecified open wound, right hip, initial encounter: Secondary | ICD-10-CM | POA: Diagnosis not present

## 2020-07-19 DIAGNOSIS — J4530 Mild persistent asthma, uncomplicated: Secondary | ICD-10-CM | POA: Insufficient documentation

## 2020-07-19 DIAGNOSIS — Z7722 Contact with and (suspected) exposure to environmental tobacco smoke (acute) (chronic): Secondary | ICD-10-CM | POA: Insufficient documentation

## 2020-07-19 DIAGNOSIS — W3400XA Accidental discharge from unspecified firearms or gun, initial encounter: Secondary | ICD-10-CM | POA: Insufficient documentation

## 2020-07-19 DIAGNOSIS — S31000A Unspecified open wound of lower back and pelvis without penetration into retroperitoneum, initial encounter: Secondary | ICD-10-CM | POA: Diagnosis not present

## 2020-07-19 DIAGNOSIS — S31819A Unspecified open wound of right buttock, initial encounter: Secondary | ICD-10-CM | POA: Diagnosis not present

## 2020-07-19 DIAGNOSIS — S3991XA Unspecified injury of abdomen, initial encounter: Secondary | ICD-10-CM | POA: Diagnosis not present

## 2020-07-19 DIAGNOSIS — R000 Tachycardia, unspecified: Secondary | ICD-10-CM | POA: Diagnosis not present

## 2020-07-19 DIAGNOSIS — Z7952 Long term (current) use of systemic steroids: Secondary | ICD-10-CM | POA: Insufficient documentation

## 2020-07-19 DIAGNOSIS — T797XXA Traumatic subcutaneous emphysema, initial encounter: Secondary | ICD-10-CM | POA: Diagnosis not present

## 2020-07-19 LAB — BASIC METABOLIC PANEL
Anion gap: 15 (ref 5–15)
BUN: 14 mg/dL (ref 6–20)
CO2: 20 mmol/L — ABNORMAL LOW (ref 22–32)
Calcium: 9.4 mg/dL (ref 8.9–10.3)
Chloride: 103 mmol/L (ref 98–111)
Creatinine, Ser: 1.06 mg/dL (ref 0.61–1.24)
GFR, Estimated: >60 mL/min (ref >60)
Glucose, Bld: 146 mg/dL — ABNORMAL HIGH (ref 70–99)
Potassium: 2.9 mmol/L — ABNORMAL LOW (ref 3.5–5.1)
Sodium: 138 mmol/L (ref 135–145)

## 2020-07-19 LAB — CBC
HCT: 43.9 % (ref 39.0–52.0)
Hemoglobin: 14.6 g/dL (ref 13.0–17.0)
MCH: 30.1 pg (ref 26.0–34.0)
MCHC: 33.3 g/dL (ref 30.0–36.0)
MCV: 90.5 fL (ref 80.0–100.0)
Platelets: 221 10*3/uL (ref 150–400)
RBC: 4.85 MIL/uL (ref 4.22–5.81)
RDW: 13.1 % (ref 11.5–15.5)
WBC: 9.7 10*3/uL (ref 4.0–10.5)
nRBC: 0 % (ref 0.0–0.2)

## 2020-07-19 MED ORDER — HYDROCODONE-ACETAMINOPHEN 5-325 MG PO TABS: 1.0000 | ORAL_TABLET | ORAL | 0 refills | Status: DC | PRN

## 2020-07-19 MED ORDER — IOHEXOL 300 MG/ML  SOLN
100.0000 mL | Freq: Once | INTRAMUSCULAR | Status: AC | PRN
  Administered 2020-07-19: 100 mL via INTRAVENOUS

## 2020-07-19 MED ORDER — SODIUM CHLORIDE 0.9 % IV BOLUS
1000.0000 mL | Freq: Once | INTRAVENOUS | Status: AC
  Administered 2020-07-19: 1000 mL via INTRAVENOUS

## 2020-07-19 MED ORDER — DOXYCYCLINE HYCLATE 100 MG PO CAPS: 100.0000 mg | ORAL_CAPSULE | Freq: Two times a day (BID) | ORAL | 0 refills | Status: DC
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
## 2020-07-19 NOTE — ED Notes (Signed)
Patient transported to CT 

## 2020-07-21 ENCOUNTER — Ambulatory Visit: Payer: Self-pay

## 2020-07-22 ENCOUNTER — Ambulatory Visit (INDEPENDENT_AMBULATORY_CARE_PROVIDER_SITE_OTHER): Payer: Medicaid Other | Admitting: Family Medicine

## 2020-07-22 ENCOUNTER — Encounter: Payer: Self-pay | Admitting: Family Medicine

## 2020-07-22 ENCOUNTER — Other Ambulatory Visit: Payer: Self-pay

## 2020-07-22 DIAGNOSIS — S71131D Puncture wound without foreign body, right thigh, subsequent encounter: Secondary | ICD-10-CM | POA: Insufficient documentation

## 2020-07-22 MED ORDER — HYDROCODONE-ACETAMINOPHEN 5-325 MG PO TABS: ORAL_TABLET | ORAL | 0 refills | Status: DC

## 2020-07-22 NOTE — Patient Instructions (Signed)
/  U with atel in 5 to 6 weeks, call if you need to be seen sooner  Ver important that you take entire antibiotic course as prescribed  I have prescribed 5 days of pain medication for use, if needed  Thanks for choosing [facility], we consider it a privelige to serve you.
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
## 2020-07-24 MED FILL — Hydrocodone-Acetaminophen Tab 5-325 MG: ORAL | Qty: 6 | Status: AC

## 2020-07-28 ENCOUNTER — Encounter: Payer: Self-pay | Admitting: Pediatrics

## 2020-08-27 ENCOUNTER — Encounter: Payer: Self-pay | Admitting: Internal Medicine

## 2020-08-27 ENCOUNTER — Ambulatory Visit: Payer: Medicaid Other | Admitting: Internal Medicine

## 2020-08-27 ENCOUNTER — Other Ambulatory Visit: Payer: Self-pay

## 2020-08-27 VITALS — BP 115/68 | HR 73 | Temp 97.7°F | Resp 16 | Ht 76.0 in | Wt 183.1 lb

## 2020-08-27 DIAGNOSIS — S71131D Puncture wound without foreign body, right thigh, subsequent encounter: Secondary | ICD-10-CM | POA: Diagnosis not present

## 2020-08-27 DIAGNOSIS — S0990XS Unspecified injury of head, sequela: Secondary | ICD-10-CM

## 2020-08-27 MED ORDER — IBUPROFEN 600 MG PO TABS: 600.0000 mg | ORAL_TABLET | Freq: Three times a day (TID) | ORAL | 0 refills | Status: DC | PRN
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
## 2020-08-27 NOTE — Assessment & Plan Note (Signed)
Wounds clean, healing well No discharge or surrounding erythema Still has mild pain - Ibuprofen PRN
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
## 2020-08-27 NOTE — Patient Instructions (Signed)
Please keep the area clean and dry.  Okay to apply Neosporin for local itching.

## 2020-08-27 NOTE — Addendum Note (Signed)
Addended byTrena Platt on: 08/27/2020 09:50 AM   Modules accepted: Level of Service

## 2020-11-24 ENCOUNTER — Other Ambulatory Visit: Payer: Self-pay

## 2020-11-24 ENCOUNTER — Emergency Department (HOSPITAL_COMMUNITY): Payer: Medicaid Other

## 2020-11-24 ENCOUNTER — Encounter: Payer: Self-pay | Admitting: Emergency Medicine

## 2020-11-24 ENCOUNTER — Ambulatory Visit
Admission: EM | Admit: 2020-11-24 | Discharge: 2020-11-24 | Disposition: A | Discharge status: Home / Self Care | Payer: Medicaid Other | Attending: Urgent Care | Admitting: Urgent Care

## 2020-11-24 ENCOUNTER — Emergency Department (HOSPITAL_COMMUNITY)
Admission: EM | Admit: 2020-11-24 | Discharge: 2020-11-24 | Disposition: A | Discharge status: Home / Self Care | Payer: Medicaid Other | Attending: Emergency Medicine | Admitting: Emergency Medicine

## 2020-11-24 ENCOUNTER — Encounter (HOSPITAL_COMMUNITY): Payer: Self-pay | Admitting: *Deleted

## 2020-11-24 DIAGNOSIS — Z7722 Contact with and (suspected) exposure to environmental tobacco smoke (acute) (chronic): Secondary | ICD-10-CM | POA: Insufficient documentation

## 2020-11-24 DIAGNOSIS — J45909 Unspecified asthma, uncomplicated: Secondary | ICD-10-CM | POA: Diagnosis not present

## 2020-11-24 DIAGNOSIS — S6991XA Unspecified injury of right wrist, hand and finger(s), initial encounter: Secondary | ICD-10-CM | POA: Diagnosis present

## 2020-11-24 DIAGNOSIS — Z79899 Other long term (current) drug therapy: Secondary | ICD-10-CM | POA: Insufficient documentation

## 2020-11-24 DIAGNOSIS — S61219A Laceration without foreign body of unspecified finger without damage to nail, initial encounter: Secondary | ICD-10-CM

## 2020-11-24 DIAGNOSIS — W25XXXA Contact with sharp glass, initial encounter: Secondary | ICD-10-CM | POA: Diagnosis not present

## 2020-11-24 DIAGNOSIS — T07XXXA Unspecified multiple injuries, initial encounter: Secondary | ICD-10-CM

## 2020-11-24 DIAGNOSIS — S61411A Laceration without foreign body of right hand, initial encounter: Secondary | ICD-10-CM | POA: Insufficient documentation

## 2020-11-24 DIAGNOSIS — Z751 Person awaiting admission to adequate facility elsewhere: Secondary | ICD-10-CM

## 2020-11-24 MED ORDER — LIDOCAINE-EPINEPHRINE 1 %-1:100000 IJ SOLN
10.0000 mL | Freq: Once | INTRAMUSCULAR | Status: DC
  Filled 2020-11-24: qty 10

## 2020-11-24 MED ORDER — BACITRACIN-NEOMYCIN-POLYMYXIN 400-5-5000 EX OINT: TOPICAL_OINTMENT | Freq: Once | CUTANEOUS | Status: DC

## 2020-11-24 MED ORDER — NAPROXEN 500 MG PO TABS
500.0000 mg | ORAL_TABLET | Freq: Two times a day (BID) | ORAL | 0 refills | Status: DC
Start: 2020-11-24 — End: 2020-12-05

## 2020-11-24 MED ORDER — LIDOCAINE-EPINEPHRINE (PF) 2 %-1:200000 IJ SOLN
20.0000 mL | Freq: Once | INTRAMUSCULAR | Status: AC
  Administered 2020-11-24: 20 mL

## 2020-11-24 MED ORDER — LIDOCAINE-EPINEPHRINE 2 %-1:100000 IJ SOLN: 20.0000 mL | Freq: Once | INTRAMUSCULAR | Status: DC

## 2020-11-24 NOTE — ED Provider Notes (Signed)
..  Laceration Repair  Date/Time: 11/24/2020 9:30 PM Performed by: Janell Quiet, PA-C Authorized by: Janell Quiet, PA-C   Consent:    Consent obtained:  Verbal   Consent given by:  Patient   Risks discussed:  Infection, need for additional repair, pain, poor cosmetic result and poor wound healing   Alternatives discussed:  No treatment and delayed treatment Universal protocol:    Procedure explained and questions answered to patient or proxy's satisfaction: yes     Relevant documents present and verified: yes     Test results available: yes     Imaging studies available: yes     Required blood products, implants, devices, and special equipment available: yes     Site/side marked: yes     Immediately prior to procedure, a time out was called: yes     Patient identity confirmed:  Verbally with patient Anesthesia:    Anesthesia method:  Local infiltration   Local anesthetic:  Lidocaine 1% WITH epi Laceration details:    Location:  Hand   Hand location:  L hand, dorsum   Length (cm):  10 Pre-procedure details:    Preparation:  Patient was prepped and draped in usual sterile fashion Treatment:    Area cleansed with:  Saline   Amount of cleaning:  Extensive   Irrigation solution:  Sterile saline   Irrigation method:  Syringe   Visualized foreign bodies/material removed: no     Debridement:  None   Undermining:  None Skin repair:    Repair method:  Sutures   Suture size:  5-0   Wound skin closure material used: ethilon.   Suture technique:  Simple interrupted   Number of sutures:  23 Approximation:    Approximation:  Close Repair type:    Repair type:  Intermediate Post-procedure details:    Dressing:  Non-adherent dressing   Procedure completion:  Tolerated Comments:     Patient had multiple lacerations on the dorsum of his left fingers from punching glass. One large one in zig zag pattern on the third digit on the PIP. Small laceration between the MCPs of 4th and  5th digits. Small laceration on the PIP of 4th digit. Small laceration on the PIP of 2nd digit.     Janell Quiet, [place] 11/24/20 2346    Eber Hong, MD 11/28/20 1425
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
## 2020-11-24 NOTE — Discharge Instructions (Signed)
You are in need of a higher level of care than we can provide in the urgent care setting.  He had multiple lacerations of your hand and needs thorough irrigation, evaluation for foreign body such as glass.  Please present to the emergency room now for further evaluation and management.

## 2020-11-24 NOTE — Discharge Instructions (Signed)
Please see your doctor's office within the next 10 days to have the stitches removed.  Keep an antibiotic ointment on them for the next 48 hours.  You may then clean this with warm soapy water twice a day.  Return for severe or worsening pain swelling pus or fever
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
## 2020-11-24 NOTE — ED Notes (Signed)
Patient is being discharged from the Urgent Care and sent to the Emergency Department via private vehicle . Per Marlowe Shores, PA, patient is in need of higher level of care due to complicated laceration. Patient is aware and verbalizes understanding of plan of care.  Vitals:   11/24/20 1935  BP: 133/69  Pulse: 76  Resp: 16  Temp: 99.2 F (37.3 C)  SpO2: 99%
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
## 2020-11-24 NOTE — ED Notes (Signed)
Suture cart at bedside 

## 2020-11-26 ENCOUNTER — Telehealth: Payer: Self-pay

## 2020-11-26 ENCOUNTER — Encounter: Payer: Medicaid Other | Admitting: Internal Medicine

## 2020-11-26 NOTE — Telephone Encounter (Signed)
Transition Care Management Unsuccessful Follow-up Telephone Call  Date of discharge and from where:  11/25/2020 from [facility]  Attempts:  1st Attempt  Reason for unsuccessful TCM follow-up call:  Left voice message

## 2020-11-27 NOTE — Telephone Encounter (Signed)
Transition Care Management Unsuccessful Follow-up Telephone Call  Date of discharge and from where:  11/25/2020 from [facility]  Attempts:  2nd Attempt  Reason for unsuccessful TCM follow-up call:  Left voice message  .

## 2020-11-28 NOTE — Telephone Encounter (Signed)
Transition Care Management Unsuccessful Follow-up Telephone Call  Date of discharge and from where:  11/25/2020 from [facility]  Attempts:  3rd Attempt  Reason for unsuccessful TCM follow-up call:  Unable to reach patient

## 2020-12-03 ENCOUNTER — Encounter: Payer: Medicaid Other | Admitting: Internal Medicine

## 2020-12-05 ENCOUNTER — Ambulatory Visit: Payer: Medicaid Other | Admitting: Nurse Practitioner

## 2020-12-05 ENCOUNTER — Encounter: Payer: Self-pay | Admitting: Nurse Practitioner

## 2020-12-05 ENCOUNTER — Other Ambulatory Visit: Payer: Self-pay

## 2020-12-05 VITALS — BP 97/62 | HR 105 | Ht 76.0 in | Wt 164.0 lb

## 2020-12-05 DIAGNOSIS — G44209 Tension-type headache, unspecified, not intractable: Secondary | ICD-10-CM

## 2020-12-05 DIAGNOSIS — L309 Dermatitis, unspecified: Secondary | ICD-10-CM | POA: Diagnosis not present

## 2020-12-05 DIAGNOSIS — J4520 Mild intermittent asthma, uncomplicated: Secondary | ICD-10-CM

## 2020-12-05 DIAGNOSIS — J4521 Mild intermittent asthma with (acute) exacerbation: Secondary | ICD-10-CM

## 2020-12-05 DIAGNOSIS — Z0000 Encounter for general adult medical examination without abnormal findings: Secondary | ICD-10-CM

## 2020-12-05 DIAGNOSIS — K219 Gastro-esophageal reflux disease without esophagitis: Secondary | ICD-10-CM

## 2020-12-05 DIAGNOSIS — Z0001 Encounter for general adult medical examination with abnormal findings: Secondary | ICD-10-CM

## 2020-12-05 DIAGNOSIS — J309 Allergic rhinitis, unspecified: Secondary | ICD-10-CM

## 2020-12-05 DIAGNOSIS — J4530 Mild persistent asthma, uncomplicated: Secondary | ICD-10-CM

## 2020-12-05 DIAGNOSIS — Z23 Encounter for immunization: Secondary | ICD-10-CM | POA: Diagnosis not present

## 2020-12-05 DIAGNOSIS — T148XXA Other injury of unspecified body region, initial encounter: Secondary | ICD-10-CM

## 2020-12-05 MED ORDER — HYDROCORTISONE 2.5 % EX CREA: TOPICAL_CREAM | CUTANEOUS | 0 refills | Status: DC

## 2020-12-05 MED ORDER — FLOVENT HFA 110 MCG/ACT IN AERO: INHALATION_SPRAY | RESPIRATORY_TRACT | 5 refills | Status: DC

## 2020-12-05 MED ORDER — LANSOPRAZOLE 30 MG PO CPDR: 30.0000 mg | DELAYED_RELEASE_CAPSULE | Freq: Two times a day (BID) | ORAL | 3 refills | Status: DC

## 2020-12-05 MED ORDER — BACITRACIN 500 UNIT/GM EX OINT: 1.0000 "application " | TOPICAL_OINTMENT | Freq: Three times a day (TID) | CUTANEOUS | 0 refills | Status: AC

## 2020-12-05 MED ORDER — SUMATRIPTAN SUCCINATE 25 MG PO TABS: 25.0000 mg | ORAL_TABLET | Freq: Two times a day (BID) | ORAL | 0 refills | Status: DC | PRN

## 2020-12-05 MED ORDER — ALBUTEROL SULFATE HFA 108 (90 BASE) MCG/ACT IN AERS
INHALATION_SPRAY | RESPIRATORY_TRACT | 1 refills | Status: DC
Start: 2020-12-05 — End: 2021-01-06

## 2020-12-05 MED ORDER — NAPROXEN 500 MG PO TABS: 500.0000 mg | ORAL_TABLET | Freq: Two times a day (BID) | ORAL | 0 refills | Status: AC | PRN

## 2020-12-05 MED ORDER — CETIRIZINE HCL 10 MG PO TABS: 10.0000 mg | ORAL_TABLET | Freq: Every day | ORAL | 5 refills | Status: DC

## 2020-12-05 NOTE — Patient Instructions (Addendum)
Your medication have been refilled.  Apply the antibiotics cream to your fingers three times daily for 5 days, keep the wound clean and dry. May cover site with a sterile bandage.   Regular use of seat belt is important stay.    It is important that you exercise regularly at least 30 minutes 5 times a week.  Think about what you will eat, plan ahead. Choose " clean, green, fresh or frozen" over canned, processed or packaged foods which are more sugary, salty and fatty. 70 to 75% of food eaten should be vegetables and fruit. Three meals at set times with snacks allowed between meals, but they must be fruit or vegetables. Aim to eat over a 12 hour period , example 7 am to 7 pm, and STOP after  your last meal of the day. Drink water,generally about 64 ounces per day, no other drink is as healthy. Fruit juice is best enjoyed in a healthy way, by EATING the fruit.  Thanks for choosing [facility], we consider it a privelige to serve you.

## 2020-12-06 DIAGNOSIS — Z0000 Encounter for general adult medical examination without abnormal findings: Secondary | ICD-10-CM | POA: Insufficient documentation

## 2020-12-06 DIAGNOSIS — G44209 Tension-type headache, unspecified, not intractable: Secondary | ICD-10-CM | POA: Insufficient documentation

## 2020-12-06 DIAGNOSIS — Z0001 Encounter for general adult medical examination with abnormal findings: Secondary | ICD-10-CM | POA: Insufficient documentation

## 2020-12-06 DIAGNOSIS — J4521 Mild intermittent asthma with (acute) exacerbation: Secondary | ICD-10-CM | POA: Insufficient documentation

## 2020-12-06 DIAGNOSIS — Z23 Encounter for immunization: Secondary | ICD-10-CM | POA: Insufficient documentation

## 2020-12-06 DIAGNOSIS — T148XXA Other injury of unspecified body region, initial encounter: Secondary | ICD-10-CM | POA: Insufficient documentation

## 2020-12-06 NOTE — Assessment & Plan Note (Signed)
Condition well controlled. Continue  current medications.
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
## 2020-12-06 NOTE — Assessment & Plan Note (Signed)
Well controlled.  Continue current med.  

## 2020-12-06 NOTE — Assessment & Plan Note (Signed)
Condition well controlled continue current med.

## 2020-12-06 NOTE — Assessment & Plan Note (Signed)
Site noted to be inflamed, wound healing well with scabs around site. All sutures on the right hand and fingers removed. Bleeding controlled with steri stripes. Site clean, sensation intact.  Antibacterial ointment applied to site after sutures removal. Cleans steri stripe given to PT with instructions given to apply bacitracin ointment to site three times daily for 5 days.

## 2020-12-06 NOTE — Assessment & Plan Note (Signed)
Well controlled; continue current med 

## 2021-01-06 ENCOUNTER — Other Ambulatory Visit: Payer: Self-pay | Admitting: Nurse Practitioner

## 2021-01-06 DIAGNOSIS — J4520 Mild intermittent asthma, uncomplicated: Secondary | ICD-10-CM

## 2021-03-10 NOTE — Telephone Encounter (Signed)
Entered in error ° °1:19 PM, 03/10/21 °M. Kelly Amani Marseille, PT, DPT °Physical Therapist- [place] °Office Number: [phone] °

## 2021-04-21 ENCOUNTER — Other Ambulatory Visit: Payer: Self-pay

## 2021-04-21 ENCOUNTER — Ambulatory Visit
Admission: EM | Admit: 2021-04-21 | Discharge: 2021-04-21 | Disposition: A | Discharge status: Home / Self Care | Payer: Medicaid Other | Attending: Family Medicine | Admitting: Family Medicine

## 2021-04-21 ENCOUNTER — Encounter: Payer: Self-pay | Admitting: Emergency Medicine

## 2021-04-21 DIAGNOSIS — R300 Dysuria: Secondary | ICD-10-CM | POA: Insufficient documentation

## 2021-04-21 DIAGNOSIS — R369 Urethral discharge, unspecified: Secondary | ICD-10-CM | POA: Diagnosis not present

## 2021-04-21 MED ORDER — DOXYCYCLINE HYCLATE 100 MG PO CAPS: 100.0000 mg | ORAL_CAPSULE | Freq: Two times a day (BID) | ORAL | 0 refills | Status: DC

## 2021-04-21 MED ORDER — CEFTRIAXONE SODIUM 500 MG IJ SOLR
500.0000 mg | Freq: Once | INTRAMUSCULAR | Status: AC
  Administered 2021-04-21: 500 mg via INTRAMUSCULAR

## 2021-04-21 NOTE — ED Notes (Signed)
Pt denies any shortness of breath, chest pain, etc. Pt alert and oriented. Airway patent. Ambulated with steady gait.  ?

## 2021-04-21 NOTE — ED Notes (Addendum)
Pt reported "main allergy is amoxicillin and I can take the rocephin shot". PA aware and discussed with pt and reported would give IM injection and monitor pt for 30 minutes in room. ? ?Pt aware of signs and symptoms of allergic reaction. ?

## 2021-04-21 NOTE — ED Notes (Signed)
Pt denies any new symptoms,itching, hives, rash. Pt alert, oriented. Airway patent. ?

## 2021-04-21 NOTE — ED Triage Notes (Signed)
Pt reports greenish-yellow discharge to penis x2 days ago. Pt also reports intermittent dysuria and hematuria. Last unprotected intercourse x1 week. ?
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
## 2021-04-21 NOTE — Discharge Instructions (Signed)
Call you when your results come in and adjust your treatment based on these.  Please avoid any sexual contact in the meantime and at least 7 days after treatment ?

## 2021-04-22 LAB — HIV ANTIBODY (ROUTINE TESTING W REFLEX): HIV Screen 4th Generation wRfx: NONREACTIVE

## 2021-04-22 LAB — RPR: RPR Ser Ql: NONREACTIVE

## 2021-04-23 ENCOUNTER — Telehealth (HOSPITAL_COMMUNITY): Payer: Self-pay | Admitting: Emergency Medicine

## 2021-04-23 LAB — CYTOLOGY, (ORAL, ANAL, URETHRAL) ANCILLARY ONLY
Chlamydia: NEGATIVE
Comment: NEGATIVE
Comment: NEGATIVE
Comment: NORMAL
Neisseria Gonorrhea: POSITIVE — AB
Trichomonas: POSITIVE — AB

## 2021-04-23 MED ORDER — METRONIDAZOLE 500 MG PO TABS: 2000.0000 mg | ORAL_TABLET | Freq: Once | ORAL | 0 refills | Status: AC

## 2021-05-21 ENCOUNTER — Encounter: Payer: Self-pay | Admitting: *Deleted

## 2021-12-07 ENCOUNTER — Ambulatory Visit: Payer: Medicaid Other | Admitting: Internal Medicine

## 2021-12-23 ENCOUNTER — Ambulatory Visit: Payer: Medicaid Other | Admitting: Internal Medicine

## 2021-12-23 ENCOUNTER — Encounter: Payer: Self-pay | Admitting: Internal Medicine

## 2021-12-23 VITALS — BP 136/79 | HR 76 | Ht 76.0 in | Wt 192.7 lb

## 2021-12-23 DIAGNOSIS — Z23 Encounter for immunization: Secondary | ICD-10-CM | POA: Diagnosis not present

## 2021-12-23 DIAGNOSIS — R739 Hyperglycemia, unspecified: Secondary | ICD-10-CM

## 2021-12-23 DIAGNOSIS — Z0001 Encounter for general adult medical examination with abnormal findings: Secondary | ICD-10-CM | POA: Diagnosis not present

## 2021-12-23 DIAGNOSIS — E559 Vitamin D deficiency, unspecified: Secondary | ICD-10-CM | POA: Diagnosis not present

## 2021-12-23 DIAGNOSIS — J4530 Mild persistent asthma, uncomplicated: Secondary | ICD-10-CM | POA: Diagnosis not present

## 2021-12-23 DIAGNOSIS — G44209 Tension-type headache, unspecified, not intractable: Secondary | ICD-10-CM

## 2021-12-23 DIAGNOSIS — Z1159 Encounter for screening for other viral diseases: Secondary | ICD-10-CM

## 2021-12-23 DIAGNOSIS — Z113 Encounter for screening for infections with a predominantly sexual mode of transmission: Secondary | ICD-10-CM | POA: Diagnosis not present

## 2021-12-23 MED ORDER — VENTOLIN HFA 108 (90 BASE) MCG/ACT IN AERS: 2.0000 | INHALATION_SPRAY | Freq: Four times a day (QID) | RESPIRATORY_TRACT | 2 refills | Status: DC | PRN

## 2021-12-23 MED ORDER — FLOVENT HFA 110 MCG/ACT IN AERO: INHALATION_SPRAY | RESPIRATORY_TRACT | 5 refills | Status: DC

## 2021-12-23 MED ORDER — SUMATRIPTAN SUCCINATE 25 MG PO TABS: 25.0000 mg | ORAL_TABLET | Freq: Two times a day (BID) | ORAL | 0 refills | Status: DC | PRN

## 2021-12-23 NOTE — Patient Instructions (Signed)
Please continue taking medications as prescribed.  Please continue to perform moderate exercise/walking at least 150 mins/week. 

## 2021-12-23 NOTE — Assessment & Plan Note (Signed)
Well-controlled with Flovent and PRN Albuterol

## 2021-12-23 NOTE — Assessment & Plan Note (Signed)
Improves with Imitrex as needed

## 2021-12-23 NOTE — Assessment & Plan Note (Addendum)
Physical exam as documented. Fasting blood tests ordered. Flu vaccine today. 
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
## 2021-12-24 ENCOUNTER — Telehealth: Payer: Self-pay | Admitting: Internal Medicine

## 2021-12-24 LAB — CMP14+EGFR
ALT: 13 IU/L (ref 0–44)
AST: 17 IU/L (ref 0–40)
Albumin/Globulin Ratio: 1.9 (ref 1.2–2.2)
Albumin: 4.9 g/dL (ref 4.3–5.2)
Alkaline Phosphatase: 84 IU/L (ref 44–121)
BUN/Creatinine Ratio: 12 (ref 9–20)
BUN: 13 mg/dL (ref 6–20)
Bilirubin Total: 1.1 mg/dL (ref 0.0–1.2)
CO2: 24 mmol/L (ref 20–29)
Calcium: 9.7 mg/dL (ref 8.7–10.2)
Chloride: 104 mmol/L (ref 96–106)
Creatinine, Ser: 1.11 mg/dL (ref 0.76–1.27)
Globulin, Total: 2.6 g/dL (ref 1.5–4.5)
Glucose: 96 mg/dL (ref 70–99)
Potassium: 4.3 mmol/L (ref 3.5–5.2)
Sodium: 140 mmol/L (ref 134–144)
Total Protein: 7.5 g/dL (ref 6.0–8.5)
eGFR: 96 mL/min/{1.73_m2} (ref >59)

## 2021-12-24 LAB — HEMOGLOBIN A1C
Est. average glucose Bld gHb Est-mCnc: 108 mg/dL
Hgb A1c MFr Bld: 5.4 % (ref 4.8–5.6)

## 2021-12-24 LAB — CBC WITH DIFFERENTIAL/PLATELET
Basophils Absolute: 0.1 10*3/uL (ref 0.0–0.2)
Basos: 1 %
EOS (ABSOLUTE): 0.2 10*3/uL (ref 0.0–0.4)
Eos: 3 %
Hematocrit: 48.3 % (ref 37.5–51.0)
Hemoglobin: 16.2 g/dL (ref 13.0–17.7)
Immature Grans (Abs): 0 10*3/uL (ref 0.0–0.1)
Immature Granulocytes: 0 %
Lymphocytes Absolute: 2.2 10*3/uL (ref 0.7–3.1)
Lymphs: 36 %
MCH: 30.8 pg (ref 26.6–33.0)
MCHC: 33.5 g/dL (ref 31.5–35.7)
MCV: 92 fL (ref 79–97)
Monocytes Absolute: 0.5 10*3/uL (ref 0.1–0.9)
Monocytes: 8 %
Neutrophils Absolute: 3.2 10*3/uL (ref 1.4–7.0)
Neutrophils: 52 %
Platelets: 238 10*3/uL (ref 150–450)
RBC: 5.26 x10E6/uL (ref 4.14–5.80)
RDW: 12.1 % (ref 11.6–15.4)
WBC: 6.1 10*3/uL (ref 3.4–10.8)

## 2021-12-24 LAB — VITAMIN D 25 HYDROXY (VIT D DEFICIENCY, FRACTURES): Vit D, 25-Hydroxy: 9 ng/mL — ABNORMAL LOW (ref 30.0–100.0)

## 2021-12-24 LAB — HIV ANTIBODY (ROUTINE TESTING W REFLEX): HIV Screen 4th Generation wRfx: NONREACTIVE

## 2021-12-24 LAB — TSH: TSH: 0.445 u[IU]/mL — ABNORMAL LOW (ref 0.450–4.500)

## 2021-12-24 LAB — HEPATITIS C ANTIBODY: Hep C Virus Ab: NONREACTIVE

## 2021-12-24 LAB — RPR: RPR Ser Ql: NONREACTIVE

## 2021-12-24 LAB — HSV 1 AND 2 AB, IGG
HSV 1 Glycoprotein G Ab, IgG: 42.1 index — ABNORMAL HIGH (ref 0.00–0.90)
HSV 2 IgG, Type Spec: <0.91 index (ref 0.00–0.90)

## 2021-12-24 NOTE — Telephone Encounter (Signed)
Patient advised.

## 2021-12-24 NOTE — Telephone Encounter (Signed)
Pt came by office for lab results. Wants a call with results.

## 2021-12-28 ENCOUNTER — Ambulatory Visit (INDEPENDENT_AMBULATORY_CARE_PROVIDER_SITE_OTHER): Payer: Medicaid Other

## 2021-12-28 ENCOUNTER — Encounter: Payer: Self-pay | Admitting: Internal Medicine

## 2021-12-28 ENCOUNTER — Other Ambulatory Visit: Payer: Self-pay

## 2021-12-28 DIAGNOSIS — Z113 Encounter for screening for infections with a predominantly sexual mode of transmission: Secondary | ICD-10-CM

## 2021-12-28 MED ORDER — CEFTRIAXONE SODIUM 1 G IJ SOLR
1.0000 g | Freq: Once | INTRAMUSCULAR | Status: AC
  Administered 2021-12-28: 1 g via INTRAMUSCULAR

## 2021-12-30 LAB — CHLAMYDIA/GC NAA, CONFIRMATION
Chlamydia trachomatis, NAA: NEGATIVE
Neisseria gonorrhoeae, NAA: POSITIVE — AB

## 2021-12-30 LAB — N. GONORRHOEAE NAA, CONFIRM: N. gonorrhoeae NAA, Confirm: POSITIVE — AB

## 2022-02-19 ENCOUNTER — Other Ambulatory Visit: Payer: Self-pay | Admitting: Nurse Practitioner

## 2022-02-19 DIAGNOSIS — K219 Gastro-esophageal reflux disease without esophagitis: Secondary | ICD-10-CM
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
## 2022-11-03 ENCOUNTER — Ambulatory Visit
Admission: RE | Admit: 2022-11-03 | Discharge: 2022-11-03 | Disposition: A | Discharge status: Home / Self Care | Payer: MEDICAID | Source: Ambulatory Visit | Attending: Nurse Practitioner | Admitting: Nurse Practitioner

## 2022-11-03 VITALS — BP 149/84 | HR 95 | Temp 98.3°F

## 2022-11-03 DIAGNOSIS — J22 Unspecified acute lower respiratory infection: Secondary | ICD-10-CM | POA: Diagnosis not present

## 2022-11-03 DIAGNOSIS — J45901 Unspecified asthma with (acute) exacerbation: Secondary | ICD-10-CM | POA: Diagnosis not present

## 2022-11-03 MED ORDER — PROMETHAZINE-DM 6.25-15 MG/5ML PO SYRP: 5.0000 mL | ORAL_SOLUTION | Freq: Four times a day (QID) | ORAL | 0 refills | Status: DC | PRN

## 2022-11-03 MED ORDER — PREDNISONE 20 MG PO TABS: 40.0000 mg | ORAL_TABLET | Freq: Every day | ORAL | 0 refills | Status: AC

## 2022-11-03 MED ORDER — DOXYCYCLINE HYCLATE 100 MG PO TABS: 100.0000 mg | ORAL_TABLET | Freq: Two times a day (BID) | ORAL | 0 refills | Status: AC
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
## 2022-11-03 NOTE — Discharge Instructions (Signed)
Take medication as prescribed. Increase fluids and allow for plenty of rest. Recommend over-the-counter Tylenol or ibuprofen as needed for pain, fever, or general discomfort. May use normal saline nasal spray throughout the day for nasal congestion and runny nose. For your cough, recommend using a humidifier in your bedroom at nighttime during sleep and sleeping elevated on pillows while cough symptoms persist. If symptoms fail to improve with this treatment, you may follow-up in this clinic or with your primary care physician for further evaluation. Follow-up as needed.

## 2022-11-03 NOTE — ED Triage Notes (Signed)
Pt c/o of coughing, fever, vomiting, and body weakness that started 2 weeks ago. Taking nyquil, dayquil has found relief but has not resolved sx's.

## 2022-11-22 ENCOUNTER — Ambulatory Visit
Admission: RE | Admit: 2022-11-22 | Discharge: 2022-11-22 | Disposition: A | Discharge status: Home / Self Care | Payer: MEDICAID | Source: Ambulatory Visit

## 2022-11-22 VITALS — BP 127/83 | HR 71 | Temp 97.7°F | Resp 16

## 2022-11-22 DIAGNOSIS — R195 Other fecal abnormalities: Secondary | ICD-10-CM

## 2022-11-22 DIAGNOSIS — R059 Cough, unspecified: Secondary | ICD-10-CM | POA: Diagnosis not present

## 2022-11-22 DIAGNOSIS — R5383 Other fatigue: Secondary | ICD-10-CM | POA: Diagnosis not present

## 2022-11-22 DIAGNOSIS — R112 Nausea with vomiting, unspecified: Secondary | ICD-10-CM

## 2022-11-22 MED ORDER — ONDANSETRON 4 MG PO TBDP: 4.0000 mg | ORAL_TABLET | Freq: Three times a day (TID) | ORAL | 0 refills | Status: DC | PRN

## 2022-11-22 NOTE — ED Triage Notes (Signed)
Body aches, fatigue, vomiting and diarrhea that comes and goes, cough x 3 weeks. Pt states it did get better with prescribed medications from 10/16 visit but symptoms came back.
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
## 2022-11-22 NOTE — Discharge Instructions (Addendum)
Take medication as prescribed. Increase fluids and allow for plenty of rest. Recommend increasing the fiber in your diet to help with loose stools. Recommend a brat diet while nausea persists.  This includes bananas, rice, applesauce, and toast. For your fatigue, would like for you to follow-up with your primary care physician for further evaluation. If you are continuing to experience nausea, vomiting, diarrhea, and constipation, please discussed a referral to gastroenterology with your primary care physician. Follow-up as needed.

## 2022-11-24 NOTE — Patient Instructions (Signed)

## 2022-11-24 NOTE — Progress Notes (Unsigned)
   Established Patient Office Visit   Subjective  Patient ID: Chris Austin, male    DOB: 09/14/2000  Age: 22 y.o. MRN: [id number]  No chief complaint on file.   He  has a past medical history of Asthma and Unspecified asthma(493.90) (08/23/2012).  HPI  ROS    Objective:     There were no vitals taken for this visit. {Vitals History (Optional):23777}  Physical Exam   No results found for any visits on 11/25/22.  The ASCVD Risk score (Arnett DK, et al., 2019) failed to calculate for the following reasons:   The 2019 ASCVD risk score is only valid for ages 81 to 29    Assessment & Plan:  There are no diagnoses linked to this encounter.  No follow-ups on file.   Cruzita Lederer Newman Nip, FNP

## 2022-11-25 ENCOUNTER — Other Ambulatory Visit (HOSPITAL_COMMUNITY)
Admission: RE | Admit: 2022-11-25 | Discharge: 2022-11-25 | Disposition: A | Discharge status: Home / Self Care | Payer: MEDICAID | Source: Ambulatory Visit | Attending: Family Medicine | Admitting: Family Medicine

## 2022-11-25 ENCOUNTER — Encounter: Payer: Self-pay | Admitting: Family Medicine

## 2022-11-25 ENCOUNTER — Ambulatory Visit: Payer: MEDICAID | Admitting: Family Medicine

## 2022-11-25 VITALS — BP 119/71 | HR 89 | Ht 76.0 in | Wt 198.1 lb

## 2022-11-25 DIAGNOSIS — G4700 Insomnia, unspecified: Secondary | ICD-10-CM

## 2022-11-25 DIAGNOSIS — J4530 Mild persistent asthma, uncomplicated: Secondary | ICD-10-CM | POA: Diagnosis not present

## 2022-11-25 DIAGNOSIS — L309 Dermatitis, unspecified: Secondary | ICD-10-CM

## 2022-11-25 DIAGNOSIS — R5383 Other fatigue: Secondary | ICD-10-CM | POA: Diagnosis not present

## 2022-11-25 DIAGNOSIS — Z114 Encounter for screening for human immunodeficiency virus [HIV]: Secondary | ICD-10-CM

## 2022-11-25 DIAGNOSIS — A64 Unspecified sexually transmitted disease: Secondary | ICD-10-CM

## 2022-11-25 DIAGNOSIS — K219 Gastro-esophageal reflux disease without esophagitis: Secondary | ICD-10-CM

## 2022-11-25 DIAGNOSIS — J309 Allergic rhinitis, unspecified: Secondary | ICD-10-CM | POA: Diagnosis not present

## 2022-11-25 DIAGNOSIS — G44209 Tension-type headache, unspecified, not intractable: Secondary | ICD-10-CM

## 2022-11-25 MED ORDER — HYDROCORTISONE 2.5 % EX CREA: TOPICAL_CREAM | CUTANEOUS | 0 refills | Status: AC

## 2022-11-25 MED ORDER — CLONIDINE HCL 0.1 MG PO TABS: 0.1000 mg | ORAL_TABLET | Freq: Every evening | ORAL | 2 refills | Status: DC | PRN

## 2022-11-25 MED ORDER — FLOVENT HFA 110 MCG/ACT IN AERO: INHALATION_SPRAY | RESPIRATORY_TRACT | 5 refills | Status: DC

## 2022-11-25 MED ORDER — ONDANSETRON 4 MG PO TBDP: 4.0000 mg | ORAL_TABLET | Freq: Three times a day (TID) | ORAL | 0 refills | Status: DC | PRN

## 2022-11-25 MED ORDER — LANSOPRAZOLE 30 MG PO CPDR: 30.0000 mg | DELAYED_RELEASE_CAPSULE | Freq: Two times a day (BID) | ORAL | 3 refills | Status: DC

## 2022-11-25 MED ORDER — TRAZODONE HCL 100 MG PO TABS: 100.0000 mg | ORAL_TABLET | Freq: Every day | ORAL | 2 refills | Status: AC

## 2022-11-25 MED ORDER — CITALOPRAM HYDROBROMIDE 20 MG PO TABS: 20.0000 mg | ORAL_TABLET | Freq: Every day | ORAL | 2 refills | Status: DC

## 2022-11-25 MED ORDER — SUMATRIPTAN SUCCINATE 25 MG PO TABS: 25.0000 mg | ORAL_TABLET | Freq: Two times a day (BID) | ORAL | 0 refills | Status: AC | PRN

## 2022-11-25 MED ORDER — FLUTICASONE PROPIONATE 50 MCG/ACT NA SUSP: 2.0000 | Freq: Every day | NASAL | 6 refills | Status: DC

## 2022-11-25 MED ORDER — VENTOLIN HFA 108 (90 BASE) MCG/ACT IN AERS
2.0000 | INHALATION_SPRAY | Freq: Four times a day (QID) | RESPIRATORY_TRACT | 2 refills | Status: DC | PRN
Start: 2022-11-25 — End: 2023-10-17

## 2022-11-25 MED ORDER — BENZONATATE 200 MG PO CAPS: 200.0000 mg | ORAL_CAPSULE | Freq: Two times a day (BID) | ORAL | 0 refills | Status: DC | PRN

## 2022-11-25 NOTE — Assessment & Plan Note (Signed)
Labs ordered to rule out any deficiency  Advise prioritize a consistent sleep schedule, stay hydrated, and engage in light physical activity to boost energy levels. Incorporate short breaks throughout the day to rest and reset your focus. Eating balanced meals and reducing stress with relaxation techniques can also help improve overall energy.

## 2022-11-25 NOTE — Assessment & Plan Note (Addendum)
Trial on Clonidine 0.1 mg at bedtime Taper off Trazodone in 3-4 weeks  Explained to go to bed at the same time each night and get up at the same time each morning, including on the weekends. Make sure your bedroom is quiet, dark, relaxing, and at a comfortable temperature. Remove electronic devices, such as TVs, computers, and smart phones, from the bedroom.  Referral placed to sleep studies

## 2022-11-26 ENCOUNTER — Telehealth: Payer: Self-pay | Admitting: Internal Medicine

## 2022-11-26 NOTE — Telephone Encounter (Signed)
Copied from CRM [id number]. Topic: Clinical - Medication Question >> Nov 26, 2022  4:02 PM Desma Mcgregor wrote:  Reason for CRM: Jenetta Loges (parent) called in stating that the pts new insurance [facility] is not covering the benzonatate (TESSALON) 200 MG capsule. She is requesting that an alternative be sent for a liquid form of the medication. He was recently prescribed promethazine-dextromethorphan (PROMETHAZINE-DM) 6.25-15 MG/5ML syrup and asking if that can be sent instead. CB# [phone]

## 2022-11-27 LAB — IRON,TIBC AND FERRITIN PANEL
Ferritin: 186 ng/mL (ref 30–400)
Iron Saturation: 36 % (ref 15–55)
Iron: 100 ug/dL (ref 38–169)
Total Iron Binding Capacity: 279 ug/dL (ref 250–450)
UIBC: 179 ug/dL (ref 111–343)

## 2022-11-27 LAB — CBC WITH DIFFERENTIAL/PLATELET
Basophils Absolute: 0.1 10*3/uL (ref 0.0–0.2)
Basos: 1 %
EOS (ABSOLUTE): 0.2 10*3/uL (ref 0.0–0.4)
Eos: 3 %
Hematocrit: 41.3 % (ref 37.5–51.0)
Hemoglobin: 13.7 g/dL (ref 13.0–17.7)
Immature Grans (Abs): 0 10*3/uL (ref 0.0–0.1)
Immature Granulocytes: 0 %
Lymphocytes Absolute: 2.8 10*3/uL (ref 0.7–3.1)
Lymphs: 47 %
MCH: 31.4 pg (ref 26.6–33.0)
MCHC: 33.2 g/dL (ref 31.5–35.7)
MCV: 95 fL (ref 79–97)
Monocytes Absolute: 0.4 10*3/uL (ref 0.1–0.9)
Monocytes: 7 %
Neutrophils Absolute: 2.6 10*3/uL (ref 1.4–7.0)
Neutrophils: 42 %
Platelets: 202 10*3/uL (ref 150–450)
RBC: 4.37 x10E6/uL (ref 4.14–5.80)
RDW: 12.4 % (ref 11.6–15.4)
WBC: 6.1 10*3/uL (ref 3.4–10.8)

## 2022-11-27 LAB — BMP8+EGFR
BUN/Creatinine Ratio: 15 (ref 9–20)
BUN: 13 mg/dL (ref 6–20)
CO2: 25 mmol/L (ref 20–29)
Calcium: 9.3 mg/dL (ref 8.7–10.2)
Chloride: 103 mmol/L (ref 96–106)
Creatinine, Ser: 0.88 mg/dL (ref 0.76–1.27)
Glucose: 83 mg/dL (ref 70–99)
Potassium: 4.4 mmol/L (ref 3.5–5.2)
Sodium: 140 mmol/L (ref 134–144)
eGFR: 124 mL/min/{1.73_m2} (ref >59)

## 2022-11-27 LAB — VITAMIN B12: Vitamin B-12: 741 pg/mL (ref 232–1245)

## 2022-11-27 LAB — HIV ANTIBODY (ROUTINE TESTING W REFLEX): HIV Screen 4th Generation wRfx: NONREACTIVE

## 2022-11-27 LAB — TSH+FREE T4
Free T4: 1.55 ng/dL (ref 0.82–1.77)
TSH: 0.518 u[IU]/mL (ref 0.450–4.500)

## 2022-11-27 LAB — VITAMIN D 25 HYDROXY (VIT D DEFICIENCY, FRACTURES): Vit D, 25-Hydroxy: 21.4 ng/mL — ABNORMAL LOW (ref 30.0–100.0)

## 2022-11-29 ENCOUNTER — Other Ambulatory Visit: Payer: Self-pay | Admitting: Internal Medicine

## 2022-11-29 ENCOUNTER — Other Ambulatory Visit: Payer: Self-pay | Admitting: Family Medicine

## 2022-11-29 DIAGNOSIS — R059 Cough, unspecified: Secondary | ICD-10-CM

## 2022-11-29 LAB — URINE CYTOLOGY ANCILLARY ONLY
Chlamydia: NEGATIVE
Comment: NEGATIVE
Comment: NEGATIVE
Comment: NORMAL
Neisseria Gonorrhea: POSITIVE — AB
Trichomonas: NEGATIVE

## 2022-11-29 MED ORDER — PROMETHAZINE-DM 6.25-15 MG/5ML PO SYRP
5.0000 mL | ORAL_SOLUTION | Freq: Four times a day (QID) | ORAL | 0 refills | Status: DC | PRN
Start: 2022-11-29 — End: 2023-02-03

## 2022-11-29 NOTE — Telephone Encounter (Signed)
Mother advised.  

## 2022-12-01 ENCOUNTER — Other Ambulatory Visit: Payer: Self-pay | Admitting: Family Medicine

## 2022-12-01 ENCOUNTER — Telehealth: Payer: Self-pay

## 2022-12-01 MED ORDER — AZITHROMYCIN 500 MG PO TABS: ORAL_TABLET | ORAL | 0 refills | Status: DC

## 2022-12-01 NOTE — Telephone Encounter (Signed)
No worries :)

## 2022-12-01 NOTE — Telephone Encounter (Signed)
Thank you :)

## 2022-12-01 NOTE — Progress Notes (Signed)
Please Inform the patient that their test results are positive for gonorrhea. The prescribed treatment is a single dose of 2000 mg (2 grams) of azithromycin, which will be provided as four 500 mg tablets to be taken all at once. Additionally, to help prevent the spread of STIs in the future, practice safe sexual habits, including consistent condom use, regular STI testing, and open communication with partners about sexual health

## 2022-12-01 NOTE — Telephone Encounter (Signed)
Reported to health dept.

## 2022-12-01 NOTE — Telephone Encounter (Signed)
Copied from CRM [id number]. Topic: Clinical - Medication Question >> Dec 01, 2022  2:38 PM Herbert Seta B wrote: Reason for CRM: [facility] Dept. Communicable diseases calling because patient has been diagnosed Gonorrhea and must be reported. Treatment of azithromycin (ZITHROMAX) 500 MG tablet does not satisfy requirements. Please contact [phone]

## 2022-12-02 ENCOUNTER — Ambulatory Visit
Admission: EM | Admit: 2022-12-02 | Discharge: 2022-12-02 | Disposition: A | Discharge status: Home / Self Care | Payer: MEDICAID | Attending: Nurse Practitioner | Admitting: Nurse Practitioner

## 2022-12-02 DIAGNOSIS — A549 Gonococcal infection, unspecified: Secondary | ICD-10-CM

## 2022-12-02 MED ORDER — CEFTRIAXONE SODIUM 1 G IJ SOLR
500.0000 mg | Freq: Once | INTRAMUSCULAR | Status: AC
  Administered 2022-12-02: 500 mg via INTRAMUSCULAR

## 2022-12-27 ENCOUNTER — Encounter: Payer: Medicaid Other | Admitting: Internal Medicine

## 2023-02-03 ENCOUNTER — Ambulatory Visit (INDEPENDENT_AMBULATORY_CARE_PROVIDER_SITE_OTHER): Payer: MEDICAID | Admitting: Internal Medicine

## 2023-02-03 ENCOUNTER — Encounter: Payer: Self-pay | Admitting: Internal Medicine

## 2023-02-03 VITALS — BP 132/82 | HR 92 | Ht 76.0 in | Wt 197.6 lb

## 2023-02-03 DIAGNOSIS — Z23 Encounter for immunization: Secondary | ICD-10-CM

## 2023-02-03 DIAGNOSIS — J4530 Mild persistent asthma, uncomplicated: Secondary | ICD-10-CM | POA: Diagnosis not present

## 2023-02-03 DIAGNOSIS — F17210 Nicotine dependence, cigarettes, uncomplicated: Secondary | ICD-10-CM | POA: Diagnosis not present

## 2023-02-03 DIAGNOSIS — F3161 Bipolar disorder, current episode mixed, mild: Secondary | ICD-10-CM | POA: Insufficient documentation

## 2023-02-03 DIAGNOSIS — Z0001 Encounter for general adult medical examination with abnormal findings: Secondary | ICD-10-CM | POA: Diagnosis not present

## 2023-02-03 DIAGNOSIS — K219 Gastro-esophageal reflux disease without esophagitis: Secondary | ICD-10-CM

## 2023-02-03 DIAGNOSIS — J309 Allergic rhinitis, unspecified: Secondary | ICD-10-CM

## 2023-02-03 DIAGNOSIS — K529 Noninfective gastroenteritis and colitis, unspecified: Secondary | ICD-10-CM

## 2023-02-03 DIAGNOSIS — Z113 Encounter for screening for infections with a predominantly sexual mode of transmission: Secondary | ICD-10-CM

## 2023-02-03 DIAGNOSIS — Z720 Tobacco use: Secondary | ICD-10-CM | POA: Insufficient documentation

## 2023-02-03 MED ORDER — LANSOPRAZOLE 30 MG PO CPDR: 30.0000 mg | DELAYED_RELEASE_CAPSULE | Freq: Every day | ORAL | 5 refills | Status: AC

## 2023-02-03 MED ORDER — ONDANSETRON 4 MG PO TBDP: 4.0000 mg | ORAL_TABLET | Freq: Three times a day (TID) | ORAL | 0 refills | Status: DC | PRN

## 2023-02-03 NOTE — Assessment & Plan Note (Signed)
Asked about quitting: confirms that he/she currently smokes cigarettes Advise to quit smoking: Educated about QUITTING to reduce the risk of cancer, cardio and cerebrovascular disease. Assess willingness: Unwilling to quit at this time, but is working on cutting back. Assist with counseling and pharmacotherapy: Counseled for 5 minutes and literature provided. Arrange for follow up: follow up in 3 months and continue to offer help.  
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
## 2023-02-03 NOTE — Assessment & Plan Note (Signed)
Well-controlled with Flovent and PRN Albuterol 

## 2023-02-03 NOTE — Patient Instructions (Signed)
Please start taking Zofran as needed for nausea/vomiting.  Please maintain at least 64 ounces of fluid in a day and additional 250 ml for each episode of diarrhea or vomiting.  Please continue to take other medications as prescribed.

## 2023-02-03 NOTE — Assessment & Plan Note (Signed)
Zofran PRN for nausea Advised to maintain adequate hydration Reassured that most of the gastroenteritis are self resolving

## 2023-02-03 NOTE — Assessment & Plan Note (Addendum)
Overall well controlled with Celexa and trazodone Followed by [facility] psychiatry

## 2023-02-03 NOTE — Assessment & Plan Note (Signed)
Epigastric discomfort can be due to GERD as well He has run out of his Prevacid, restart Prevacid 30 mg QD instead of twice daily

## 2023-02-03 NOTE — Assessment & Plan Note (Signed)
Continue Zyrtec and Flonase.

## 2023-02-03 NOTE — Assessment & Plan Note (Signed)
Physical exam as documented. Fasting blood tests reviewed today. Flu vaccine today.

## 2023-02-05 LAB — CHLAMYDIA/GC NAA, CONFIRMATION
Chlamydia trachomatis, NAA: NEGATIVE
Neisseria gonorrhoeae, NAA: NEGATIVE

## 2023-02-07 ENCOUNTER — Encounter: Payer: Self-pay | Admitting: Internal Medicine

## 2023-05-05 ENCOUNTER — Ambulatory Visit
Admission: RE | Admit: 2023-05-05 | Discharge: 2023-05-05 | Disposition: A | Discharge status: Home / Self Care | Payer: MEDICAID | Source: Ambulatory Visit | Attending: Nurse Practitioner | Admitting: Nurse Practitioner

## 2023-05-05 VITALS — BP 123/76 | HR 106 | Temp 98.4°F | Resp 20

## 2023-05-05 DIAGNOSIS — J069 Acute upper respiratory infection, unspecified: Secondary | ICD-10-CM | POA: Diagnosis present

## 2023-05-05 LAB — POC COVID19/FLU A&B COMBO
Covid Antigen, POC: NEGATIVE
Influenza A Antigen, POC: NEGATIVE
Influenza B Antigen, POC: NEGATIVE

## 2023-05-05 LAB — POCT RAPID STREP A (OFFICE): Rapid Strep A Screen: NEGATIVE

## 2023-05-05 MED ORDER — FLUTICASONE PROPIONATE 50 MCG/ACT NA SUSP: 2.0000 | Freq: Every day | NASAL | 0 refills | Status: AC

## 2023-05-05 MED ORDER — PROMETHAZINE-DM 6.25-15 MG/5ML PO SYRP: 5.0000 mL | ORAL_SOLUTION | Freq: Four times a day (QID) | ORAL | 0 refills | Status: DC | PRN

## 2023-05-05 MED ORDER — CETIRIZINE HCL 10 MG PO TABS: 10.0000 mg | ORAL_TABLET | Freq: Every day | ORAL | 0 refills | Status: DC
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
## 2023-05-05 NOTE — Discharge Instructions (Signed)
 The COVID/Flu test and rapid strep test were negative. A throat culture is pending.  You will be contacted if the pending test result is positive.  You will also have access to the results via MyChart. Take medication as prescribed. Increase fluids and allow for plenty of rest. Recommend Tylenol or ibuprofen as needed for pain, fever, or general discomfort. Warm salt water gargles 3-4 times daily to help with throat pain or discomfort.  You may also use over-the-counter Chloraseptic throat spray and throat lozenges as needed for throat pain or discomfort. For your cough, recommend using a humidifier in the bedroom at nighttime during sleep and sleeping elevated on pillows while symptoms persist. Follow-up with your primary care physician if symptoms fail to improve. Follow-up as needed.

## 2023-05-05 NOTE — ED Triage Notes (Signed)
 Pt reports cough, congestion, tiredness, chills, sore throat.

## 2023-05-08 LAB — CULTURE, GROUP A STREP (THRC)

## 2023-07-13 ENCOUNTER — Other Ambulatory Visit: Payer: Self-pay | Admitting: Family Medicine

## 2023-08-03 ENCOUNTER — Ambulatory Visit: Payer: MEDICAID | Admitting: Internal Medicine

## 2023-08-08 ENCOUNTER — Encounter: Payer: Self-pay | Admitting: Internal Medicine

## 2023-09-08 ENCOUNTER — Other Ambulatory Visit: Payer: Self-pay | Admitting: Family Medicine

## 2023-09-12 ENCOUNTER — Telehealth: Payer: Self-pay | Admitting: Internal Medicine

## 2023-09-12 ENCOUNTER — Other Ambulatory Visit: Payer: Self-pay | Admitting: Internal Medicine

## 2023-09-12 DIAGNOSIS — F3161 Bipolar disorder, current episode mixed, mild: Secondary | ICD-10-CM

## 2023-09-12 MED ORDER — CITALOPRAM HYDROBROMIDE 20 MG PO TABS
20.0000 mg | ORAL_TABLET | Freq: Every day | ORAL | 1 refills | Status: DC
Start: 2023-09-12 — End: 2023-10-24

## 2023-09-12 NOTE — Telephone Encounter (Signed)
 Patient is needing refill on Citalopram - scheduled patient with soonest available. Patient missed last appointment due to being incarcerated and is needing refill to hold him off until next appt. Please advise [facility] on [address] Thank you

## 2023-10-07 ENCOUNTER — Encounter: Payer: Self-pay | Admitting: *Deleted

## 2023-10-17 ENCOUNTER — Ambulatory Visit (INDEPENDENT_AMBULATORY_CARE_PROVIDER_SITE_OTHER): Payer: MEDICAID | Admitting: Internal Medicine

## 2023-10-17 ENCOUNTER — Encounter: Payer: Self-pay | Admitting: Internal Medicine

## 2023-10-17 VITALS — BP 114/71 | HR 82 | Ht 76.0 in | Wt 213.2 lb

## 2023-10-17 DIAGNOSIS — J4530 Mild persistent asthma, uncomplicated: Secondary | ICD-10-CM

## 2023-10-17 DIAGNOSIS — J309 Allergic rhinitis, unspecified: Secondary | ICD-10-CM

## 2023-10-17 DIAGNOSIS — K219 Gastro-esophageal reflux disease without esophagitis: Secondary | ICD-10-CM | POA: Diagnosis not present

## 2023-10-17 DIAGNOSIS — E559 Vitamin D deficiency, unspecified: Secondary | ICD-10-CM

## 2023-10-17 DIAGNOSIS — Z23 Encounter for immunization: Secondary | ICD-10-CM

## 2023-10-17 DIAGNOSIS — F3161 Bipolar disorder, current episode mixed, mild: Secondary | ICD-10-CM

## 2023-10-17 DIAGNOSIS — E785 Hyperlipidemia, unspecified: Secondary | ICD-10-CM

## 2023-10-17 DIAGNOSIS — L02421 Furuncle of right axilla: Secondary | ICD-10-CM | POA: Insufficient documentation

## 2023-10-17 MED ORDER — SULFAMETHOXAZOLE-TRIMETHOPRIM 800-160 MG PO TABS: 1.0000 | ORAL_TABLET | Freq: Two times a day (BID) | ORAL | 0 refills | Status: AC

## 2023-10-17 MED ORDER — BUDESONIDE-FORMOTEROL FUMARATE 80-4.5 MCG/ACT IN AERO: 2.0000 | INHALATION_SPRAY | Freq: Two times a day (BID) | RESPIRATORY_TRACT | 3 refills | Status: AC

## 2023-10-17 MED ORDER — ALBUTEROL SULFATE HFA 108 (90 BASE) MCG/ACT IN AERS: 2.0000 | INHALATION_SPRAY | Freq: Four times a day (QID) | RESPIRATORY_TRACT | 2 refills | Status: AC | PRN

## 2023-10-17 MED ORDER — PROMETHAZINE-DM 6.25-15 MG/5ML PO SYRP: 5.0000 mL | ORAL_SOLUTION | Freq: Four times a day (QID) | ORAL | 0 refills | Status: AC | PRN
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
## 2023-10-17 NOTE — Patient Instructions (Signed)
 Please start using Symbicort twice daily for asthma. Please use Albuterol  only as needed for shortness of breath or wheezing.  Please take Cephalexin for boil. Please apply warm compresses in the right axilla.

## 2023-10-18 ENCOUNTER — Ambulatory Visit: Payer: Self-pay | Admitting: Internal Medicine

## 2023-10-18 LAB — TSH: TSH: 0.339 u[IU]/mL — ABNORMAL LOW (ref 0.450–4.500)

## 2023-10-18 LAB — CMP14+EGFR
ALT: 11 IU/L (ref 0–44)
AST: 14 IU/L (ref 0–40)
Albumin: 4.6 g/dL (ref 4.3–5.2)
Alkaline Phosphatase: 77 IU/L (ref 47–123)
BUN/Creatinine Ratio: 15 (ref 9–20)
BUN: 14 mg/dL (ref 6–20)
Bilirubin Total: 0.4 mg/dL (ref 0.0–1.2)
CO2: 23 mmol/L (ref 20–29)
Calcium: 9.3 mg/dL (ref 8.7–10.2)
Chloride: 103 mmol/L (ref 96–106)
Creatinine, Ser: 0.96 mg/dL (ref 0.76–1.27)
Globulin, Total: 2.2 g/dL (ref 1.5–4.5)
Glucose: 95 mg/dL (ref 70–99)
Potassium: 4.6 mmol/L (ref 3.5–5.2)
Sodium: 139 mmol/L (ref 134–144)
Total Protein: 6.8 g/dL (ref 6.0–8.5)
eGFR: 113 mL/min/1.73 (ref >59)

## 2023-10-18 LAB — CBC WITH DIFFERENTIAL/PLATELET
Basophils Absolute: 0.1 x10E3/uL (ref 0.0–0.2)
Basos: 1 %
EOS (ABSOLUTE): 0.2 x10E3/uL (ref 0.0–0.4)
Eos: 3 %
Hematocrit: 43.9 % (ref 37.5–51.0)
Hemoglobin: 14.5 g/dL (ref 13.0–17.7)
Immature Grans (Abs): 0 x10E3/uL (ref 0.0–0.1)
Immature Granulocytes: 0 %
Lymphocytes Absolute: 2.4 x10E3/uL (ref 0.7–3.1)
Lymphs: 37 %
MCH: 32 pg (ref 26.6–33.0)
MCHC: 33 g/dL (ref 31.5–35.7)
MCV: 97 fL (ref 79–97)
Monocytes Absolute: 0.5 x10E3/uL (ref 0.1–0.9)
Monocytes: 7 %
Neutrophils Absolute: 3.4 x10E3/uL (ref 1.4–7.0)
Neutrophils: 52 %
Platelets: 200 x10E3/uL (ref 150–450)
RBC: 4.53 x10E6/uL (ref 4.14–5.80)
RDW: 13.4 % (ref 11.6–15.4)
WBC: 6.5 x10E3/uL (ref 3.4–10.8)

## 2023-10-18 LAB — VITAMIN D 25 HYDROXY (VIT D DEFICIENCY, FRACTURES): Vit D, 25-Hydroxy: 13.5 ng/mL — ABNORMAL LOW (ref 30.0–100.0)

## 2023-10-18 LAB — LIPID PANEL
Chol/HDL Ratio: 2.7 ratio (ref 0.0–5.0)
Cholesterol, Total: 105 mg/dL (ref 100–199)
HDL: 39 mg/dL — ABNORMAL LOW (ref >39)
LDL Chol Calc (NIH): 54 mg/dL (ref 0–99)
Triglycerides: 46 mg/dL (ref 0–149)
VLDL Cholesterol Cal: 12 mg/dL (ref 5–40)

## 2023-10-18 MED ORDER — VITAMIN D (ERGOCALCIFEROL) 1.25 MG (50000 UNIT) PO CAPS: 50000.0000 [IU] | ORAL_CAPSULE | ORAL | 1 refills | Status: AC

## 2023-10-21 NOTE — Assessment & Plan Note (Signed)
 Epigastric discomfort can be due to GERD as well Continue Prevacid  30 mg QD as needed

## 2023-10-21 NOTE — Assessment & Plan Note (Signed)
Continue Zyrtec and Flonase.

## 2023-10-21 NOTE — Assessment & Plan Note (Signed)
 Uncontrolled due to noncompliance Was on Flovent  and as needed albuterol  in the past Started Symbicort as maintenance inhaler Refilled albuterol  as needed for dyspnea or wheezing Promethazine -DM syrup as needed for cough

## 2023-10-21 NOTE — Assessment & Plan Note (Signed)
 Started empiric Bactrim  Perform warm compresses Advised to avoid shaving the area for the next 2 weeks If persistent pain and swelling, may need I&D

## 2023-10-21 NOTE — Assessment & Plan Note (Signed)
 Uncontrolled with Celexa  and trazodone  - reports anxiety spells Followed by [facility] psychiatry - advised to discuss with psychiatry for dose adjustment of Celexa , was refilled by us  recently per patient request as he had run out of it

## 2023-10-23 ENCOUNTER — Other Ambulatory Visit: Payer: Self-pay | Admitting: Internal Medicine

## 2023-10-23 DIAGNOSIS — F3161 Bipolar disorder, current episode mixed, mild: Secondary | ICD-10-CM

## 2024-01-10 ENCOUNTER — Other Ambulatory Visit: Payer: Self-pay | Admitting: Internal Medicine

## 2024-01-10 ENCOUNTER — Ambulatory Visit: Payer: Self-pay

## 2024-01-10 DIAGNOSIS — K529 Noninfective gastroenteritis and colitis, unspecified: Secondary | ICD-10-CM

## 2024-01-10 MED ORDER — ONDANSETRON 4 MG PO TBDP: 4.0000 mg | ORAL_TABLET | Freq: Three times a day (TID) | ORAL | 0 refills | Status: AC | PRN

## 2024-01-10 NOTE — Telephone Encounter (Signed)
" °  FYI Only or Action Required?: Action required by provider: request for appointment.  Patient was last seen in primary care on 10/17/2023 by Chris Suzzane POUR, MD.  Called Nurse Triage reporting Vomiting.  Symptoms began several weeks ago.  Interventions attempted: Rest, hydration, or home remedies.  Symptoms are: unchanged.Vomiting, diarrhea x 2 weeks.Drinking fluids well.  Triage Disposition: See Physician Within 24 Hours  Patient/caregiver understands and will follow disposition?:     Copied from CRM #[id number]. Topic: Clinical - Red Word Triage >> Jan 10, 2024  9:29 AM Winona R wrote: Vomiting and diarrhea for the past two weeks. Pt previously had gastro issues but has not been on his medication in some time. Mom is on the line but not on the DPR Pt is there with her Reason for Disposition  [1] MILD or MODERATE vomiting AND [2] present > 48 hours (2 days)  (Exception: Mild vomiting with associated diarrhea.)  Answer Assessment - Initial Assessment Questions 1. VOMITING SEVERITY: How many times have you vomited in the past 24 hours?      2-3 2. ONSET: When did the vomiting begin?      2 weeks  3. FLUIDS: What fluids or food have you vomited up today? Have you been able to keep any fluids down?     yes 4. ABDOMEN PAIN: Are your having any abdomen pain? If Yes : How bad is it and what does it feel like? (e.g., crampy, dull, intermittent, constant)      Yes  7/10  cramping 5. DIARRHEA: Is there any diarrhea? If Yes, ask: How many times today?      Yes   2-3 x day 6. CONTACTS: Is there anyone else in the family with the same symptoms?      no 7. CAUSE: What do you think is causing your vomiting?     unsure 8. HYDRATION STATUS: Any signs of dehydration? (e.g., dry mouth [not only dry lips], too weak to stand) When did you last urinate?     no 9. OTHER SYMPTOMS: Do you have any other symptoms? (e.g., fever, headache, vertigo, vomiting blood or coffee  grounds, recent head injury)     no 10. PREGNANCY: Is there any chance you are pregnant? When was your last menstrual period?       N/a  Protocols used: Vomiting-A-AH  "

## 2024-04-16 ENCOUNTER — Ambulatory Visit: Payer: MEDICAID | Admitting: Internal Medicine
